# Patient Record
Sex: Male | Born: 1980 | Race: White | Hispanic: No | Marital: Single | Smoking: Never smoker
Health system: Southern US, Community
[De-identification: ages and names within clinical notes are randomized; demographics above are authoritative.]

## PROBLEM LIST (undated history)

## (undated) DIAGNOSIS — N200 Calculus of kidney: Secondary | ICD-10-CM

## (undated) DIAGNOSIS — R21 Rash and other nonspecific skin eruption: Secondary | ICD-10-CM

## (undated) DIAGNOSIS — Z8669 Personal history of other diseases of the nervous system and sense organs: Secondary | ICD-10-CM

## (undated) DIAGNOSIS — K219 Gastro-esophageal reflux disease without esophagitis: Secondary | ICD-10-CM

## (undated) HISTORY — DX: Calculus of kidney: N20.0

## (undated) HISTORY — DX: Gastro-esophageal reflux disease without esophagitis: K21.9

## (undated) HISTORY — DX: Rash and other nonspecific skin eruption: R21

## (undated) HISTORY — DX: Personal history of other diseases of the nervous system and sense organs: Z86.69

## (undated) HISTORY — PX: LITHOTRIPSY: SUR834

---
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
## 2000-03-25 ENCOUNTER — Encounter: Payer: Self-pay | Admitting: Family Medicine

## 2000-03-25 ENCOUNTER — Ambulatory Visit (HOSPITAL_COMMUNITY): Admission: RE | Admit: 2000-03-25 | Discharge: 2000-03-25 | Payer: Self-pay | Admitting: Family Medicine

## 2007-10-28 ENCOUNTER — Emergency Department: Admit: 2007-10-28 | Payer: Self-pay | Source: Emergency Department | Admitting: Emergency Medicine

## 2007-11-21 ENCOUNTER — Ambulatory Visit
Admit: 2007-11-21 | Disposition: A | Discharge status: Home / Self Care | Payer: Self-pay | Source: Ambulatory Visit | Admitting: Orthopaedic Surgery

## 2014-09-25 ENCOUNTER — Emergency Department
Admission: EM | Admit: 2014-09-25 | Discharge: 2014-09-25 | Disposition: A | Discharge status: Home / Self Care | Payer: Commercial Managed Care - POS | Attending: Emergency Medicine | Admitting: Emergency Medicine

## 2014-09-25 ENCOUNTER — Emergency Department: Payer: Commercial Managed Care - POS

## 2014-09-25 DIAGNOSIS — N132 Hydronephrosis with renal and ureteral calculous obstruction: Secondary | ICD-10-CM | POA: Insufficient documentation

## 2014-09-25 DIAGNOSIS — Z87442 Personal history of urinary calculi: Secondary | ICD-10-CM | POA: Insufficient documentation

## 2014-09-25 DIAGNOSIS — N201 Calculus of ureter: Secondary | ICD-10-CM

## 2014-09-25 HISTORY — DX: Calculus of kidney: N20.0

## 2014-09-25 LAB — COMPREHENSIVE METABOLIC PANEL
ALT: 39 U/L (ref 0–55)
AST (SGOT): 19 U/L (ref 5–34)
Albumin/Globulin Ratio: 1.4 (ref 0.9–2.2)
Albumin: 4 g/dL (ref 3.5–5.0)
Alkaline Phosphatase: 68 U/L (ref 38–106)
Anion Gap: 14 (ref 5.0–15.0)
BUN: 19 mg/dL (ref 9.0–28.0)
Bilirubin, Total: 2.2 mg/dL — ABNORMAL HIGH (ref 0.2–1.2)
CO2: 20 mEq/L — ABNORMAL LOW (ref 22–29)
Calcium: 9.2 mg/dL (ref 8.5–10.5)
Chloride: 107 mEq/L (ref 100–111)
Creatinine: 1.1 mg/dL (ref 0.7–1.3)
Globulin: 2.8 g/dL (ref 2.0–3.6)
Glucose: 121 mg/dL — ABNORMAL HIGH (ref 70–100)
Potassium: 4.1 mEq/L (ref 3.5–5.1)
Protein, Total: 6.8 g/dL (ref 6.0–8.3)
Sodium: 141 mEq/L (ref 136–145)

## 2014-09-25 LAB — URINALYSIS
Bilirubin, UA: NEGATIVE
Glucose, UA: NEGATIVE
Ketones UA: NEGATIVE
Leukocyte Esterase, UA: NEGATIVE
Nitrite, UA: NEGATIVE
Protein, UR: 30 — AB
Specific Gravity UA: 1.03 (ref 1.001–1.035)
Urine pH: 5 (ref 5.0–8.0)
Urobilinogen, UA: <2 mg/dL (ref 0.2–2.0)

## 2014-09-25 LAB — CBC AND DIFFERENTIAL
Basophils Absolute Automated: 0.05 10*3/uL (ref 0.00–0.20)
Basophils Automated: 0 %
Eosinophils Absolute Automated: 0.31 10*3/uL (ref 0.00–0.70)
Eosinophils Automated: 3 %
Hematocrit: 52.1 % — ABNORMAL HIGH (ref 42.0–52.0)
Hgb: 17.1 g/dL — ABNORMAL HIGH (ref 13.0–17.0)
Lymphocytes Absolute Automated: 3.42 10*3/uL (ref 0.50–4.40)
Lymphocytes Automated: 38 %
MCH: 29.7 pg (ref 28.0–32.0)
MCHC: 32.8 g/dL (ref 32.0–36.0)
MCV: 90.6 fL (ref 80.0–100.0)
MPV: 10.2 fL (ref 9.4–12.3)
Monocytes Absolute Automated: 0.9 10*3/uL (ref 0.00–1.20)
Monocytes: 10 %
Neutrophils Absolute: 4.45 10*3/uL (ref 1.80–8.10)
Neutrophils: 49 %
Platelets: 257 10*3/uL (ref 140–400)
RBC: 5.75 10*6/uL (ref 4.70–6.00)
RDW: 14 % (ref 12–15)
WBC: 9.13 10*3/uL (ref 3.50–10.80)

## 2014-09-25 LAB — RAPID DRUG SCREEN, URINE
Barbiturate Screen, UR: NEGATIVE
Benzodiazepine Screen, UR: NEGATIVE
Cannabinoid Screen, UR: NEGATIVE
Cocaine, UR: NEGATIVE
Opiate Screen, UR: POSITIVE — AB
PCP Screen, UR: NEGATIVE
Urine Amphetamine Screen: NEGATIVE

## 2014-09-25 LAB — URINE MICROSCOPIC

## 2014-09-25 LAB — ETHANOL: Alcohol: NOT DETECTED mg/dL

## 2014-09-25 LAB — GFR: EGFR: >60

## 2014-09-25 LAB — LIPASE: Lipase: 17 U/L (ref 8–78)

## 2014-09-25 MED ORDER — KETOROLAC TROMETHAMINE 30 MG/ML IJ SOLN
INTRAMUSCULAR | Status: AC
Start: 2014-09-25 — End: 2014-09-25
  Administered 2014-09-25: 30 mg via INTRAVENOUS
  Filled 2014-09-25: qty 1

## 2014-09-25 MED ORDER — MORPHINE SULFATE 4 MG/ML IJ/IV SOLN (WRAP)
4.0000 mg | Freq: Once | Status: AC
Start: 2014-09-25 — End: 2014-09-25
  Administered 2014-09-25: 4 mg via INTRAVENOUS
  Filled 2014-09-25: qty 1

## 2014-09-25 MED ORDER — HYDROMORPHONE HCL 1 MG/ML IJ SOLN
1.0000 mg | Freq: Once | INTRAMUSCULAR | Status: AC
Start: 2014-09-25 — End: 2014-09-25
  Administered 2014-09-25: 1 mg via INTRAVENOUS
  Filled 2014-09-25: qty 1

## 2014-09-25 MED ORDER — ONDANSETRON 4 MG PO TBDP
4.0000 mg | ORAL_TABLET | Freq: Four times a day (QID) | ORAL | Status: AC | PRN
Start: 2014-09-25 — End: 2014-10-02

## 2014-09-25 MED ORDER — OXYCODONE-ACETAMINOPHEN 5-325 MG PO TABS
2.0000 | ORAL_TABLET | Freq: Once | ORAL | Status: AC
Start: 2014-09-25 — End: 2014-09-25
  Administered 2014-09-25: 2 via ORAL
  Filled 2014-09-25: qty 2

## 2014-09-25 MED ORDER — OXYCODONE-ACETAMINOPHEN 5-325 MG PO TABS
ORAL_TABLET | ORAL | Status: DC
Start: 2014-09-25 — End: 2014-09-30

## 2014-09-25 MED ORDER — ONDANSETRON HCL 4 MG/2ML IJ SOLN
4.0000 mg | Freq: Once | INTRAMUSCULAR | Status: AC
Start: 2014-09-25 — End: 2014-09-25
  Administered 2014-09-25: 4 mg via INTRAVENOUS
  Filled 2014-09-25: qty 2

## 2014-09-25 MED ORDER — SODIUM CHLORIDE 0.9 % IV BOLUS
1000.0000 mL | Freq: Once | INTRAVENOUS | Status: AC
Start: 2014-09-25 — End: 2014-09-25
  Administered 2014-09-25: 1000 mL via INTRAVENOUS

## 2014-09-25 MED ORDER — KETOROLAC TROMETHAMINE 30 MG/ML IJ SOLN
30.0000 mg | Freq: Once | INTRAMUSCULAR | Status: AC
Start: 2014-09-25 — End: 2014-09-25

## 2014-09-25 NOTE — ED Provider Notes (Addendum)
Physician/Midlevel provider first contact with patient: 09/25/14 0410         History     Chief Complaint   Patient presents with   . Abdominal Pain     HPI Comments: Sudden onset of sharp left flank and LLQ pain no dysuria/urgency nausea no vomiting - 245 am awakened patient .    No other issues no cough no fever no URI no dizziness    Patient is a 34 y.o. male presenting with abdominal pain.   Abdominal Pain  Pain location:  L flank  Pain quality: sharp    Pain radiates to:  LLQ  Pain severity:  Severe  Onset quality:  Sudden  Duration:  1 hour  Timing:  Constant  Progression:  Waxing and waning  Chronicity:  New  Relieved by:  Nothing  Worsened by:  Nothing tried  Ineffective treatments:  None tried  Associated symptoms: nausea    Associated symptoms: no anorexia, no belching, no chest pain, no chills, no constipation, no cough, no diarrhea, no dysuria, no fatigue, no fever, no flatus, no hematemesis, no hematochezia, no hematuria and no vomiting             Past Medical History   Diagnosis Date   . Calculus of kidney        History reviewed. No pertinent past surgical history.    History reviewed. No pertinent family history.    Social  Social History   Substance Use Topics   . Smoking status: Never Smoker    . Smokeless tobacco: None   . Alcohol Use: Yes      Comment: socially       .     No Known Allergies    Home Medications           No Medications           Review of Systems   Constitutional: Negative for fever, chills and fatigue.   Respiratory: Negative for cough.    Cardiovascular: Negative for chest pain.   Gastrointestinal: Positive for nausea and abdominal pain. Negative for vomiting, diarrhea, constipation, hematochezia, anorexia, flatus and hematemesis.   Genitourinary: Negative for dysuria and hematuria.   All other systems reviewed and are negative.      Physical Exam    BP: (!) 146/93 mmHg, Heart Rate: 67, Temp: 97.6 F (36.4 C), Resp Rate: 20, SpO2: 98 %, Weight: 99.791 kg    Physical Exam    Constitutional: He appears well-developed and well-nourished. He appears distressed.   HENT:   Head: Normocephalic and atraumatic.   Right Ear: External ear normal.   Left Ear: External ear normal.   Nose: Nose normal.   Mouth/Throat: Oropharynx is clear and moist. No oropharyngeal exudate.   Eyes: Conjunctivae and EOM are normal. Pupils are equal, round, and reactive to light. Right eye exhibits no discharge.   Neck: Normal range of motion. Neck supple.   Cardiovascular: Normal rate, regular rhythm, normal heart sounds and intact distal pulses.  Exam reveals no gallop and no friction rub.    No murmur heard.  Pulmonary/Chest: Effort normal and breath sounds normal. No respiratory distress. He has no wheezes. He has no rales. He exhibits no tenderness.   Abdominal: Soft. Bowel sounds are normal.   Musculoskeletal: Normal range of motion.   Neurological: He is alert. He has normal reflexes.   Skin: Skin is warm.   Diaphoretic   Psychiatric: He has a normal mood and affect. His behavior  is normal. Judgment normal.   Nursing note and vitals reviewed.        MDM and ED Course     ED Medication Orders     Start Ordered     Status Ordering Provider    09/25/14 [phone] 09/25/14 9604  HYDROmorphone (DILAUDID) injection 1 mg   Once     Route: Intravenous  Ordered Dose: 1 mg     Last MAR action:  Given Jarom Govan ALAN    09/25/14 0625 09/25/14 0624  oxyCODONE-acetaminophen (PERCOCET) 5-325 MG per tablet 2 tablet   Once     Route: Oral  Ordered Dose: 2 tablet     Last MAR action:  Given Trayonna Bachmeier ALAN    09/25/14 0522 09/25/14 0521  HYDROmorphone (DILAUDID) injection 1 mg   Once     Route: Intravenous  Ordered Dose: 1 mg     Last MAR action:  Given Javaris Wigington ALAN    09/25/14 0429 09/25/14 0428  ketorolac (TORADOL) injection 30 mg   Once     Route: Intravenous  Ordered Dose: 30 mg     Last MAR action:  Given Dioselina Brumbaugh ALAN    09/25/14 0415 09/25/14 0414  sodium chloride 0.9 % bolus 1,000 mL    Once     Route: Intravenous  Ordered Dose: 1,000 mL     Last MAR action:  Stopped Chimamanda Siegfried ALAN    09/25/14 0415 09/25/14 0414  morphine injection 4 mg   Once     Route: Intravenous  Ordered Dose: 4 mg     Last MAR action:  Given Peyson Delao ALAN    09/25/14 0415 09/25/14 0414  ondansetron (ZOFRAN) injection 4 mg   Once     Route: Intravenous  Ordered Dose: 4 mg     Last MAR action:  Given Kenry Daubert ALAN             MDM  Number of Diagnoses or Management Options     Amount and/or Complexity of Data Reviewed  Clinical lab tests: ordered and reviewed  Tests in the radiology section of CPT: ordered and reviewed    Risk of Complications, Morbidity, and/or Mortality  Presenting problems: moderate  Diagnostic procedures: moderate  Management options: moderate    Patient Progress  Patient progress: stable            Procedures    Clinical Impression & Disposition     Clinical Impression  Final diagnoses:   Ureterolithiasis        ED Disposition     Discharge Alric Quan discharge to home/self care.    Condition at disposition: Stable             Discharge Medication List as of 09/25/2014  5:22 AM      START taking these medications    Details   ondansetron (ZOFRAN ODT) 4 MG disintegrating tablet Take 1 tablet (4 mg total) by mouth every 6 (six) hours as needed., Starting 09/25/2014, Until Wed 10/02/14, Print      oxyCODONE-acetaminophen (PERCOCET) 5-325 MG per tablet 1-2 tablets by mouth every 4-6 hours as needed for pain;  Do not drive or operate machinery while taking this medicine, Print                   INDICATION: Left flank pain.    COMPARISON: None.    TECHNIQUE: Noncontrast helical axial CT through the abdomen and pelvis  using 2.5 mm slices with coronal  and sagittal reconstructions.    FINDINGS: The lung bases, lower heart, liver, gallbladder, spleen,  adrenal glands, pancreas, stomach, small bowel, large bowel, appendix,  prostate, urinary bladder, osseous structures and subcutaneous  soft  tissues are unremarkable.    5 x 5 mm obstructing proximal left ureteral calculus with mild to  moderate proximal hydroureter and hydronephrosis. Additionally,  prominent nonobstructing lower renal hilum calculus on the right.    IMPRESSION:   5 x 5 mm obstructing proximal left ureteral calculus with  mild to moderate proximal hydroureter and hydronephrosis.    Aleen Sells, MD   09/25/2014 6:14 AM      Ralph Dowdy Claudius Sis, MD  09/25/14 1610    Joseph Berkshire, MD  09/25/14 9604    Joseph Berkshire, MD  09/27/14 [phone]

## 2014-09-25 NOTE — ED Notes (Signed)
To room per wheelchair alert orientedx4 c/o of left lower quadrant pain since about 0245 , no diarrhea, no vomiting.

## 2014-09-27 ENCOUNTER — Emergency Department: Payer: Commercial Managed Care - POS

## 2014-09-27 ENCOUNTER — Emergency Department
Admission: EM | Admit: 2014-09-27 | Discharge: 2014-09-27 | Disposition: A | Discharge status: Home / Self Care | Payer: Commercial Managed Care - POS | Attending: Emergency Medicine | Admitting: Emergency Medicine

## 2014-09-27 DIAGNOSIS — N200 Calculus of kidney: Secondary | ICD-10-CM | POA: Insufficient documentation

## 2014-09-27 DIAGNOSIS — Z87442 Personal history of urinary calculi: Secondary | ICD-10-CM | POA: Insufficient documentation

## 2014-09-27 MED ORDER — MORPHINE SULFATE 2 MG/ML IJ/IV SOLN (WRAP)
8.0000 mg | Freq: Once | Status: AC
Start: 2014-09-27 — End: 2014-09-27
  Administered 2014-09-27: 8 mg via INTRAVENOUS
  Filled 2014-09-27: qty 4

## 2014-09-27 MED ORDER — SODIUM CHLORIDE 0.9 % IV BOLUS
1000.0000 mL | Freq: Once | INTRAVENOUS | Status: AC
Start: 2014-09-27 — End: 2014-09-27
  Administered 2014-09-27: 1000 mL via INTRAVENOUS

## 2014-09-27 MED ORDER — KETOROLAC TROMETHAMINE 30 MG/ML IJ SOLN
15.0000 mg | Freq: Once | INTRAMUSCULAR | Status: AC
Start: 2014-09-27 — End: 2014-09-27
  Administered 2014-09-27: 15 mg via INTRAVENOUS
  Filled 2014-09-27: qty 1

## 2014-09-27 MED ORDER — ONDANSETRON HCL 4 MG/2ML IJ SOLN
4.0000 mg | Freq: Once | INTRAMUSCULAR | Status: AC
Start: 2014-09-27 — End: 2014-09-27
  Administered 2014-09-27: 4 mg via INTRAVENOUS
  Filled 2014-09-27: qty 2

## 2014-09-27 MED ORDER — IBUPROFEN 600 MG PO TABS
600.0000 mg | ORAL_TABLET | Freq: Four times a day (QID) | ORAL | Status: DC | PRN
Start: 2014-09-27 — End: 2014-11-20

## 2014-09-27 MED ORDER — HYDROCODONE-ACETAMINOPHEN 5-325 MG PO TABS
1.0000 | ORAL_TABLET | ORAL | Status: DC | PRN
Start: 2014-09-27 — End: 2014-11-20

## 2014-09-27 NOTE — Discharge Instructions (Signed)
Prevention Of Recurrent Kidney Stones (Edu)     Here is some information about how to lower your chance of getting a kidney stone again.     Studies show that anyone with a kidney stone in the past has a 1 in 2 chance of having one again within 5 years. However, there are things you can do to reduce this risk.     Your kidneys main job is to filter excess water and wastes from the blood. This is how urine is made. Crystals can separate from the urine and build up or stick on the insides of the kidneys. This is how kidney stones form.      Urine normally has chemicals to keep the clumping or sticking from happening. However, if these chemicals aren t balanced, the stones can form. The solid lump(s) can be as small as a grain of sand to as big as the size of a golf ball! The medical term for a kidney stone is a renal calculus. Another name is nephrolithiasis.     Accordingly to the Kidney and Urologic Diseases Information Clearinghouse, good hydration (fluid intake) is the first step in preventing kidney stones. You should drink lots of fluids, usually about 2 liters (8 glasses) every day. Good hydration helps to water down (dilute) the amount of crystals forming in the urine. Drinking lots of water every day helps to prevent more stones from forming. Also limit alcohol and caffeine use. These can cause you to lose fluids. This results in dehydration.     Another very important factor in the prevention of kidney stones is our daily diet. A recent study from [employer] found that diet has a strong effect on the pH (acidity) of urine. A diet rich in purine foods can increase uric acid levels. This can then result in a low urine pH. Purine foods include red meat, chicken, fish and green tea. Low pH in the urine can cause crystal to form. This leads to a certain type of kidney stone.      On the other hand, strict vegetarians eat a lot of products with citrate. Some foods with a lot of citrate are grapefruits,  oranges, tangerines, lemons, pineapples and grapes. There is also a lot of citrate in carbonated drinks. This type of diet can mean your kidneys excrete more citrate. This increases your uric acid levels.     Lower levels of salt, calcium and oxalate in the diet has also been shown to lower the risk for kidney stones. Foods with a lot of sodium include canned soups and meat, frozen dinners, potato chips, some condiments and table salt. Some calcium-rich foods are dairy products, sardines and salmon. Some oxalate-rich foods are spinach, peanuts, sweet potatoes, tea, and chocolate. A lot of salt, calcium and oxalate in your diet increases the amount of calcium dumped in your urine. These high levels of calcium can cause one of the most common types of kidney stones. These are called calcium stones. About 4 in 5 of all kidney stones are calcium stones.      It is NOT recommended that you stop eating these foods altogether. It is better to restrict or limit the amount of certain foods or categories of food. Eating foods in MODERATION is best.     For more information, contact your doctor. You can also visit the Kidney and Urologic Diseases Information Clearinghouse web sites or the [employer] home page.

## 2014-09-27 NOTE — ED Notes (Signed)
Patient here for eval of left flank and lower abdominal pain.  C/o nausea/vomiting.  Alert and oriented x 3.  No apparent distress.

## 2014-09-27 NOTE — ED Provider Notes (Signed)
Physician/Midlevel provider first contact with patient: 09/27/14 1610         History     Chief Complaint   Patient presents with   . Flank Pain     Patient is a 34 y.o. male presenting with flank pain. The history is provided by the patient.   Flank Pain  This is a recurrent problem. The current episode started today. The problem occurs constantly. The problem has been unchanged. Associated symptoms include abdominal pain. Pertinent negatives include no chest pain, chills, coughing, fever or vomiting. Nothing aggravates the symptoms. He has tried nothing for the symptoms.    pt seen recently and was dx kidney stone.  Pain now lower and less in back.  No fever.  Percocet not working.  No nsaid taken.  Has apt. With urologist Monday but was just hoping I could remove it.      Past Medical History   Diagnosis Date   . Calculus of kidney        History reviewed. No pertinent past surgical history.    History reviewed. No pertinent family history.    Social  Social History   Substance Use Topics   . Smoking status: Never Smoker    . Smokeless tobacco: None   . Alcohol Use: Yes      Comment: socially       .     No Known Allergies    Home Medications     Last Medication Reconciliation Action:  In Progress Elana Alm, RN 09/27/2014 10:10 AM                  ondansetron (ZOFRAN ODT) 4 MG disintegrating tablet     Take 1 tablet (4 mg total) by mouth every 6 (six) hours as needed.     oxyCODONE-acetaminophen (PERCOCET) 5-325 MG per tablet     1-2 tablets by mouth every 4-6 hours as needed for pain;  Do not drive or operate machinery while taking this medicine           Review of Systems   Constitutional: Negative for fever and chills.   HENT: Negative for rhinorrhea.    Respiratory: Negative for cough.    Cardiovascular: Negative for chest pain.   Gastrointestinal: Positive for abdominal pain. Negative for vomiting.   Genitourinary: Positive for flank pain.   All other systems reviewed and are  negative.      Physical Exam    BP: 117/67 mmHg, Heart Rate: 86, Temp: 98.4 F (36.9 C), Resp Rate: 16, SpO2: 93 %, Weight: 99.791 kg    Physical Exam  Nursing note and vitals reviewed.  Constitutional:  Well developed, well nourished. Awake & Oriented x3.  Head:  Atraumatic. Normocephalic.    Eyes:  PERRL. EOMI. Conjunctivae are not pale.  ENT:  Mucous membranes are moist and intact. Oropharynx is clear and symmetric.  Patent airway.  Neck:  Supple. Full ROM.    Cardiovascular:  Regular rate. Regular rhythm. No murmurs, rubs, or gallops.  Pulmonary/Chest:  No evidence of respiratory distress. Clear to auscultation bilaterally.  No wheezing, rales or rhonchi.   Abdominal:  Soft and non-distended. There is very mild suprapubic tenderness. No rebound, guarding, or rigidity.  Back:  Full ROM. Mild left cva pain.  Extremities:  No edema. No cyanosis. No clubbing. Full range of motion in all extremities.  Skin:  Skin is warm and dry.  No diaphoresis. No rash.   Neurological:  Alert, awake, and appropriate.  Normal speech. Motor normal.  Psychiatric:  Good eye contact. Normal interaction, affect, and behavior.    Pox 93 low normal no tx needed.    MDM and ED Course     ED Medication Orders     Start Ordered     Status Ordering Provider    09/27/14 1133 09/27/14 1132  ketorolac (TORADOL) injection 15 mg   Once     Route: Intravenous  Ordered Dose: 15 mg     Last MAR action:  Given Lincoln Ginley C    09/27/14 1133 09/27/14 1132  morphine injection 8 mg   Once     Route: Intravenous  Ordered Dose: 8 mg     Last MAR action:  Given Sircharles Holzheimer C    09/27/14 1002 09/27/14 1001  sodium chloride 0.9 % bolus 1,000 mL   Once     Route: Intravenous  Ordered Dose: 1,000 mL     Last MAR action:  New Bag Shabre Kreher C    09/27/14 1002 09/27/14 1001  morphine injection 8 mg   Once     Route: Intravenous  Ordered Dose: 8 mg     Last MAR action:  Given Shavona Gunderman C    09/27/14 1002 09/27/14 1001  ondansetron (ZOFRAN)  injection 4 mg   Once     Route: Intravenous  Ordered Dose: 4 mg     Last MAR action:  Given Diontae Route C    09/27/14 1002 09/27/14 1001  ketorolac (TORADOL) injection 15 mg   Once     Route: Intravenous  Ordered Dose: 15 mg     Last MAR action:  Given Willisha Sligar C             MDM  Pt more comfortable after pain meds.  Will [place] to [facility]        Procedures    Clinical Impression & Disposition     Clinical Impression  Final diagnoses:   Kidney stone        ED Disposition     Discharge Alric Quan discharge to home/self care.    Condition at disposition: Stable             Discharge Medication List as of 09/27/2014 11:35 AM                      Kennith Maes, MD  09/27/14 2221

## 2014-10-02 ENCOUNTER — Encounter
Admission: RE | Disposition: A | Discharge status: Home / Self Care | Payer: Self-pay | Source: Ambulatory Visit | Attending: Urology

## 2014-10-02 ENCOUNTER — Ambulatory Visit: Payer: Commercial Managed Care - POS | Admitting: Anesthesiology

## 2014-10-02 ENCOUNTER — Ambulatory Visit: Payer: Commercial Managed Care - POS

## 2014-10-02 ENCOUNTER — Ambulatory Visit: Payer: Commercial Managed Care - POS | Admitting: Urology

## 2014-10-02 ENCOUNTER — Ambulatory Visit
Admission: RE | Admit: 2014-10-02 | Discharge: 2014-10-02 | Disposition: A | Discharge status: Home / Self Care | Payer: Commercial Managed Care - POS | Source: Ambulatory Visit | Attending: Urology | Admitting: Urology

## 2014-10-02 DIAGNOSIS — N201 Calculus of ureter: Secondary | ICD-10-CM

## 2014-10-02 DIAGNOSIS — N132 Hydronephrosis with renal and ureteral calculous obstruction: Secondary | ICD-10-CM | POA: Insufficient documentation

## 2014-10-02 HISTORY — PX: ESWL: SHX3906

## 2014-10-02 HISTORY — DX: Gastro-esophageal reflux disease without esophagitis: K21.9

## 2014-10-02 HISTORY — DX: Rash and other nonspecific skin eruption: R21

## 2014-10-02 HISTORY — DX: Personal history of other diseases of the nervous system and sense organs: Z86.69

## 2014-10-02 SURGERY — LITHOTRIPSY, EXTRACORPOREAL SHOCK WAVE (ESWL)
Anesthesia: Anesthesia General | Site: Flank | Laterality: Left | Wound class: Clean

## 2014-10-02 MED ORDER — OXYCODONE-ACETAMINOPHEN 5-325 MG PO TABS
1.0000 | ORAL_TABLET | ORAL | Status: DC | PRN
Start: 2014-10-02 — End: 2014-10-02
  Administered 2014-10-02: 1 via ORAL

## 2014-10-02 MED ORDER — ONDANSETRON HCL 4 MG/2ML IJ SOLN
INTRAMUSCULAR | Status: DC | PRN
Start: 2014-10-02 — End: 2014-10-02
  Administered 2014-10-02: 4 mg via INTRAVENOUS

## 2014-10-02 MED ORDER — EPHEDRINE SULFATE 50 MG/ML IJ SOLN
INTRAMUSCULAR | Status: AC
Start: 2014-10-02 — End: ?
  Filled 2014-10-02: qty 1

## 2014-10-02 MED ORDER — ONDANSETRON HCL 4 MG/2ML IJ SOLN
INTRAMUSCULAR | Status: AC
Start: 2014-10-02 — End: ?
  Filled 2014-10-02: qty 2

## 2014-10-02 MED ORDER — MIDAZOLAM HCL 2 MG/2ML IJ SOLN
INTRAMUSCULAR | Status: DC | PRN
Start: 2014-10-02 — End: 2014-10-02
  Administered 2014-10-02: 2 mg via INTRAVENOUS

## 2014-10-02 MED ORDER — LIDOCAINE HCL 2 % IJ SOLN
INTRAMUSCULAR | Status: DC | PRN
Start: 2014-10-02 — End: 2014-10-02
  Administered 2014-10-02: 100 mg

## 2014-10-02 MED ORDER — PROPOFOL INFUSION 10 MG/ML
INTRAVENOUS | Status: DC | PRN
Start: 2014-10-02 — End: 2014-10-02
  Administered 2014-10-02: 200 mg via INTRAVENOUS

## 2014-10-02 MED ORDER — EPHEDRINE SULFATE 50 MG/ML IJ SOLN
INTRAMUSCULAR | Status: DC | PRN
Start: 2014-10-02 — End: 2014-10-02
  Administered 2014-10-02 (×2): 10 mg via INTRAVENOUS

## 2014-10-02 MED ORDER — FENTANYL CITRATE (PF) 50 MCG/ML IJ SOLN (WRAP)
INTRAMUSCULAR | Status: AC
Start: 2014-10-02 — End: ?
  Filled 2014-10-02: qty 2

## 2014-10-02 MED ORDER — MIDAZOLAM HCL 2 MG/2ML IJ SOLN
INTRAMUSCULAR | Status: AC
Start: 2014-10-02 — End: ?
  Filled 2014-10-02: qty 2

## 2014-10-02 MED ORDER — FENTANYL CITRATE (PF) 50 MCG/ML IJ SOLN (WRAP)
INTRAMUSCULAR | Status: DC | PRN
Start: 2014-10-02 — End: 2014-10-02
  Administered 2014-10-02 (×2): 50 ug via INTRAVENOUS

## 2014-10-02 MED ORDER — LIDOCAINE HCL (PF) 2 % IJ SOLN
INTRAMUSCULAR | Status: AC
Start: 2014-10-02 — End: ?
  Filled 2014-10-02: qty 5

## 2014-10-02 MED ORDER — LACTATED RINGERS IV SOLN
INTRAVENOUS | Status: DC
Start: 2014-10-02 — End: 2014-10-02

## 2014-10-02 MED ORDER — PROPOFOL 10 MG/ML IV EMUL (WRAP)
INTRAVENOUS | Status: AC
Start: 2014-10-02 — End: ?
  Filled 2014-10-02: qty 20

## 2014-10-02 MED ORDER — OXYCODONE-ACETAMINOPHEN 5-325 MG PO TABS
ORAL_TABLET | ORAL | Status: AC
Start: 2014-10-02 — End: ?
  Filled 2014-10-02: qty 1

## 2014-10-02 SURGICAL SUPPLY — 1 items: HLDR LIMB QUILTED SINGLE STRAP (Procedure Accessories) ×2 IMPLANT

## 2014-10-02 NOTE — Discharge Instructions (Signed)
Shock Wave Lithotripsy  Passing a kidney stone can be very painful. Shock wave lithotripsyis a treatment that helps by breaking the kidney stone into smaller pieces that are easier to pass. This treatment is also called extracorporeal shock wave lithotripsy (ESWL). Lithotripsy takes about an hour. It's done in a hospital, lithotripsy center, or mobile lithotripsy van. You will likely go home the same day.This treatment is not used for all types of kidney stones. Your health care provider will discuss whether this is the right treatment for the type of stone you have.    During the Procedure   You receive medication to prevent pain and help you relax or sleep during lithotripsy. Once this takes effect, the procedure will start.   Astent (flexible tube with holes in it) may be placed into your ureter (the tube that connects the kidney and the bladder). This helps keep urine flowing from the kidney.   Your health care provider then uses X-ray or ultrasound to find the exact location of the kidney stone.   Sound waves are aimed at the stone and sent at high speed. If you're awake, you may feel a tapping as they pass through your body.  After the Procedure   You'll be monitored in a recovery room for about 1 hour to 3 hours. Antibiotics and pain medication may be prescribed before you leave.   You'll have a follow-up visit in a few weeks. If you received a stent, it will be removed. Your doctor will also check for pieces of stone. If large pieces remain, you may need a second lithotripsy or another procedure.    Passing the Stone  It can take a day to several weeks for the pieces of stone to leave your body. Drink plenty of liquids to help flush your system. During this time:   Your urine may be cloudy or slightly bloody.You may even see small pieces of stone.   You may have a slight fever and some pain. Take prescribed or over-the-counter pain medication as instructed by your health care provider.   You  may be asked to strain your urine to collect some stone particles. These will be studied in the lab.     2000-2015 [employer] [address], [place] [address]. All rights reserved. This information is not intended as a substitute for professional medical care. Always follow your healthcare professional's instructions.      Post Anesthesia Discharge Instructions    Although you may be awake and alert in the recovery room, small amounts of anesthetic remain in your system for about 24 hours.  You may feel tired and sleepy during this time.      You are advised to go directly home from the hospital.    Plan to stay at home and rest for the remainder of the day.    It is advisable to have someone with you at home for 24 hours after surgery.    Do not operate a motor vehicle, or any mechanical or electrical equipment for the next 24 hours.      Be careful when you are walking around, you may become dizzy.  The effects of anesthesia and/or medications are still present and drowsiness may occur    Do not consume alcohol, tranquilizers, sleeping medications, or any other non prescribed medication for the remainder of the day.    Diet:  begin with liquids, progress your diet as tolerated or as directed by your surgeon.    Nausea and vomiting may occur in the next 24 hours.

## 2014-10-02 NOTE — Brief Op Note (Signed)
BRIEF OP NOTE    Date Time: 10/02/2014 11:07 AM    Patient Name:   Tristan Barnes    Date of Operation:   10/02/2014    Providers Performing:   Surgeon(s):  Maurice Small Jule Ser, MD    Assistant (s):   Circulator: Renae Fickle, RN  Relief Circulator: Herma Carson, RN  Preceptor: Sharia Reeve, RN    Operative Procedure:   Procedure(s):  ESWL, LEFT URETER    Preoperative Diagnosis:   Pre-Op Diagnosis Codes:     * Ureteral calculus [N20.1]    Postoperative Diagnosis:   Post-Op Diagnosis Codes:     * Ureteral calculus [N20.1]    Anesthesia:   General    Estimated Blood Loss:    * No values recorded between 10/02/2014 10:40 AM and 10/02/2014 11:07 AM *    Implants:   * No implants in log *    Drains:   Drains: None    Specimens:   None    Findings:   Left ureteral calculus    Complications:   None      Signed by: Jeremy Johann, MD                                                                           [facility]

## 2014-10-02 NOTE — Anesthesia Postprocedure Evaluation (Signed)
Anesthesia Post Evaluation    Patient: Tristan Barnes    Procedures performed: Procedure(s):  ESWL    Anesthesia type: General LMA    Patient location:PACU    Last vitals:   Filed Vitals:    10/02/14 1200   BP: 130/80   Pulse: 84   Temp:    Resp: 16   SpO2: 98%       Post pain: Patient not complaining of pain, continue current therapy      Mental Status:awake    Respiratory Function: tolerating room air    Cardiovascular: stable    Nausea/Vomiting: patient not complaining of nausea or vomiting    Hydration Status: adequate    Post assessment: no apparent anesthetic complications

## 2014-10-02 NOTE — Transfer of Care (Signed)
Anesthesia Transfer of Care Note    Patient: Tristan Barnes    Procedures performed: Procedure(s):  ESWL    Anesthesia type: General LMA    Patient location:Phase II PACU    Last vitals:   Filed Vitals:    10/02/14 1114   BP: 131/71   Pulse: 89   Temp: 36.1 C (97 F)   Resp: 12   SpO2: 97%       Post pain: Patient not complaining of pain, continue current therapy      Mental Status:awake    Respiratory Function: tolerating room air    Cardiovascular: stable    Nausea/Vomiting: patient not complaining of nausea or vomiting    Hydration Status: adequate    Post assessment: no apparent anesthetic complications and no reportable events

## 2014-10-02 NOTE — Interval H&P Note (Signed)
H&P reviewed  Pt examined  No changes noted

## 2014-10-02 NOTE — Op Note (Signed)
Procedure Date: 10/02/2014     Patient Type: A     SURGEON: Jeremy Johann MD  ASSISTANT:       PREOPERATIVE DIAGNOSIS:  Left ureteral calculus.     POSTOPERATIVE DIAGNOSIS:  Left ureteral calculus.     TITLE OF PROCEDURE:  Extracorporeal shockwave lithotripsy to left ureteral calculus.     ANESTHESIA:  General with LMA.     INDICATIONS FOR PROCEDURE:  This 34 year old male had the recent onset of left flank pain and was found  to have a 5-mm calculus in his left ureter near L3 causing some  hydronephrosis.  KUB demonstrates a stone in similar area, although it  measures closer to 7 mm on that technique.  Due to his symptoms, he is  admitted for shockwave lithotripsy.     DESCRIPTION OF PROCEDURE:  With the patient in the supine position on the lithotripter table, he was  placed under general anesthesia.  He was then positioned so that the stone  in the left ureter was at the focal point of the shockwave generator.   Treatment was started at 1 kV and gradually increased up to 20 kV.  The  stone was easily visualized, and as it was treated, seemed decreased in its  appearance.  By the conclusion of the procedure, it was very difficult to  see any residual stone.  The patient tolerated the procedure without  difficulty and was taken to the PACU in satisfactory condition.           D:  10/02/2014 11:15 AM by Dr. Jule Ser. Maurice Small, MD [phone])  T:  10/02/2014 11:57 AM by       Everlean Cherry: [id number]) (Doc ID: [id number])

## 2014-10-02 NOTE — Anesthesia Preprocedure Evaluation (Signed)
Anesthesia Evaluation    AIRWAY    Mallampati: III    TM distance: >3 FB  Neck ROM: full  Mouth Opening:full   CARDIOVASCULAR    cardiovascular exam normal       DENTAL    no notable dental hx     PULMONARY    pulmonary exam normal     OTHER FINDINGS                      Anesthesia Plan    ASA 2     general               (The most common side effects from anesthesia are nausea, vomiting, sore throat.  Uncommon side effects include but not limited to  injury to eyes, lips, teeth, gums, vocal cords, allergic rections, nerve injuries. In addition there are side effects associated with your medical conditions.    Answered all questions to patient's satisfaction.  Pt understands and wishes to proceed.    Kajal Scalici Robert Alexee Delsanto, MD  07/20/2011 )      intravenous induction           Post op pain management: per surgeon    informed consent obtained    Plan discussed with CRNA.

## 2014-10-03 ENCOUNTER — Encounter: Payer: Self-pay | Admitting: Urology

## 2014-10-18 ENCOUNTER — Other Ambulatory Visit (FREE_STANDING_LABORATORY_FACILITY): Payer: Commercial Managed Care - POS

## 2014-10-18 DIAGNOSIS — N200 Calculus of kidney: Secondary | ICD-10-CM

## 2014-10-23 LAB — STONE ANALYSIS

## 2014-11-20 ENCOUNTER — Emergency Department
Admission: EM | Admit: 2014-11-20 | Discharge: 2014-11-20 | Disposition: A | Discharge status: Other Acute Inpatient Hospital | Payer: Commercial Managed Care - POS | Attending: Emergency Medicine | Admitting: Emergency Medicine

## 2014-11-20 ENCOUNTER — Emergency Department: Payer: Commercial Managed Care - POS

## 2014-11-20 ENCOUNTER — Observation Stay: Payer: Commercial Managed Care - POS | Admitting: Certified Registered"

## 2014-11-20 ENCOUNTER — Observation Stay: Payer: Commercial Managed Care - POS

## 2014-11-20 ENCOUNTER — Observation Stay
Admission: RE | Admit: 2014-11-20 | Discharge: 2014-11-21 | Disposition: A | Discharge status: Home / Self Care | Payer: Commercial Managed Care - POS | Source: Ambulatory Visit | Attending: Family Medicine | Admitting: Family Medicine

## 2014-11-20 ENCOUNTER — Observation Stay: Payer: Commercial Managed Care - POS | Admitting: Internal Medicine

## 2014-11-20 ENCOUNTER — Ambulatory Visit: Admission: AD | Admit: 2014-11-20 | Payer: Commercial Managed Care - POS | Source: Ambulatory Visit | Admitting: Urology

## 2014-11-20 ENCOUNTER — Encounter
Admission: RE | Disposition: A | Discharge status: Home / Self Care | Payer: Self-pay | Source: Ambulatory Visit | Attending: Urology

## 2014-11-20 DIAGNOSIS — N200 Calculus of kidney: Secondary | ICD-10-CM

## 2014-11-20 DIAGNOSIS — Z87442 Personal history of urinary calculi: Secondary | ICD-10-CM | POA: Insufficient documentation

## 2014-11-20 DIAGNOSIS — R740 Nonspecific elevation of levels of transaminase and lactic acid dehydrogenase [LDH]: Secondary | ICD-10-CM | POA: Insufficient documentation

## 2014-11-20 DIAGNOSIS — N132 Hydronephrosis with renal and ureteral calculous obstruction: Principal | ICD-10-CM | POA: Insufficient documentation

## 2014-11-20 DIAGNOSIS — K219 Gastro-esophageal reflux disease without esophagitis: Secondary | ICD-10-CM | POA: Insufficient documentation

## 2014-11-20 DIAGNOSIS — N139 Obstructive and reflux uropathy, unspecified: Secondary | ICD-10-CM

## 2014-11-20 HISTORY — PX: LITHOTRIPSY, EXTRACORPOREAL SHOCK WAVE (ESWL): SHX3906

## 2014-11-20 HISTORY — PX: ESWL: SHX3906

## 2014-11-20 LAB — URINALYSIS
Bilirubin, UA: NEGATIVE
Glucose, UA: NEGATIVE
Ketones UA: NEGATIVE
Leukocyte Esterase, UA: NEGATIVE
Nitrite, UA: NEGATIVE
Protein, UR: 30 — AB
Specific Gravity UA: 1.03 (ref 1.001–1.035)
Urine pH: 5 (ref 5.0–8.0)
Urobilinogen, UA: 2 mg/dL (ref 0.2–2.0)

## 2014-11-20 LAB — COMPREHENSIVE METABOLIC PANEL
ALT: 90 U/L — ABNORMAL HIGH (ref 0–55)
AST (SGOT): 40 U/L — ABNORMAL HIGH (ref 5–34)
Albumin/Globulin Ratio: 1.3 (ref 0.9–2.2)
Albumin: 3.9 g/dL (ref 3.5–5.0)
Alkaline Phosphatase: 76 U/L (ref 38–106)
Anion Gap: 13 (ref 5.0–15.0)
BUN: 19 mg/dL (ref 9.0–28.0)
Bilirubin, Total: 1.8 mg/dL — ABNORMAL HIGH (ref 0.2–1.2)
CO2: 20 mEq/L — ABNORMAL LOW (ref 22–29)
Calcium: 9 mg/dL (ref 8.5–10.5)
Chloride: 105 mEq/L (ref 100–111)
Creatinine: 1.1 mg/dL (ref 0.7–1.3)
Globulin: 3.1 g/dL (ref 2.0–3.6)
Glucose: 116 mg/dL — ABNORMAL HIGH (ref 70–100)
Potassium: 4.3 mEq/L (ref 3.5–5.1)
Protein, Total: 7 g/dL (ref 6.0–8.3)
Sodium: 138 mEq/L (ref 136–145)

## 2014-11-20 LAB — CBC AND DIFFERENTIAL
Basophils Absolute Automated: 0.03 10*3/uL (ref 0.00–0.20)
Basophils Automated: 0 %
Eosinophils Absolute Automated: 0.26 10*3/uL (ref 0.00–0.70)
Eosinophils Automated: 3 %
Hematocrit: 48.6 % (ref 42.0–52.0)
Hgb: 15.9 g/dL (ref 13.0–17.0)
Lymphocytes Absolute Automated: 1.9 10*3/uL (ref 0.50–4.40)
Lymphocytes Automated: 24 %
MCH: 29.8 pg (ref 28.0–32.0)
MCHC: 32.7 g/dL (ref 32.0–36.0)
MCV: 91.2 fL (ref 80.0–100.0)
MPV: 10.3 fL (ref 9.4–12.3)
Monocytes Absolute Automated: 0.71 10*3/uL (ref 0.00–1.20)
Monocytes: 9 %
Neutrophils Absolute: 5.13 10*3/uL (ref 1.80–8.10)
Neutrophils: 64 %
Platelets: 274 10*3/uL (ref 140–400)
RBC: 5.33 10*6/uL (ref 4.70–6.00)
RDW: 13 % (ref 12–15)
WBC: 8.03 10*3/uL (ref 3.50–10.80)

## 2014-11-20 LAB — URINE MICROSCOPIC

## 2014-11-20 LAB — HEMOLYSIS INDEX
Hemolysis Index: 18 (ref 0–18)
Hemolysis Index: 13 (ref 0–18)

## 2014-11-20 LAB — GFR: EGFR: >60

## 2014-11-20 LAB — HEPATITIS B SURFACE ANTIGEN W/ REFLEX TO CONFIRMATION: Hepatitis B Surface Antigen: NONREACTIVE

## 2014-11-20 LAB — PHOSPHORUS: Phosphorus: 4.6 mg/dL (ref 2.3–4.7)

## 2014-11-20 LAB — HEPATITIS C ANTIBODY: Hepatitis C, AB: NONREACTIVE

## 2014-11-20 LAB — MAGNESIUM: Magnesium: 2.2 mg/dL (ref 1.6–2.6)

## 2014-11-20 SURGERY — LITHOTRIPSY, EXTRACORPOREAL SHOCK WAVE (ESWL)
Anesthesia: Anesthesia General | Site: Flank | Laterality: Right | Wound class: Clean

## 2014-11-20 MED ORDER — ONDANSETRON 4 MG PO TBDP
4.0000 mg | ORAL_TABLET | Freq: Three times a day (TID) | ORAL | Status: DC | PRN
Start: 2014-11-20 — End: 2014-11-21

## 2014-11-20 MED ORDER — EPHEDRINE SULFATE 50 MG/ML IJ SOLN
INTRAMUSCULAR | Status: DC | PRN
Start: 2014-11-20 — End: 2014-11-20
  Administered 2014-11-20 (×2): 10 mg via INTRAVENOUS

## 2014-11-20 MED ORDER — KETOROLAC TROMETHAMINE 30 MG/ML IJ SOLN
30.0000 mg | Freq: Once | INTRAMUSCULAR | Status: AC
Start: 2014-11-20 — End: 2014-11-20
  Administered 2014-11-20: 30 mg via INTRAVENOUS
  Filled 2014-11-20: qty 1

## 2014-11-20 MED ORDER — SODIUM CHLORIDE 0.9 % IV BOLUS
1000.0000 mL | Freq: Once | INTRAVENOUS | Status: AC
Start: 2014-11-20 — End: 2014-11-20
  Administered 2014-11-20: 1000 mL via INTRAVENOUS

## 2014-11-20 MED ORDER — PROPOFOL INFUSION 10 MG/ML
INTRAVENOUS | Status: DC | PRN
Start: 2014-11-20 — End: 2014-11-20
  Administered 2014-11-20: 200 mg via INTRAVENOUS

## 2014-11-20 MED ORDER — DEXAMETHASONE SODIUM PHOSPHATE 4 MG/ML IJ SOLN (WRAP)
INTRAMUSCULAR | Status: DC | PRN
Start: 2014-11-20 — End: 2014-11-20
  Administered 2014-11-20: 8 mg via INTRAVENOUS

## 2014-11-20 MED ORDER — MIDAZOLAM HCL 2 MG/2ML IJ SOLN
INTRAMUSCULAR | Status: DC | PRN
Start: 2014-11-20 — End: 2014-11-20
  Administered 2014-11-20: 2 mg via INTRAVENOUS

## 2014-11-20 MED ORDER — ONDANSETRON HCL 4 MG/2ML IJ SOLN
4.0000 mg | Freq: Once | INTRAMUSCULAR | Status: AC
Start: 2014-11-20 — End: 2014-11-20
  Administered 2014-11-20: 4 mg via INTRAVENOUS
  Filled 2014-11-20: qty 2

## 2014-11-20 MED ORDER — DEXAMETHASONE SOD PHOSPHATE PF 10 MG/ML IJ SOLN
INTRAMUSCULAR | Status: AC
Start: 2014-11-20 — End: ?
  Filled 2014-11-20: qty 1

## 2014-11-20 MED ORDER — SODIUM CHLORIDE 0.9 % IV SOLN
INTRAVENOUS | Status: AC
Start: 2014-11-20 — End: ?
  Filled 2014-11-20: qty 100

## 2014-11-20 MED ORDER — ENOXAPARIN SODIUM 40 MG/0.4ML SC SOLN
40.0000 mg | Freq: Every day | SUBCUTANEOUS | Status: DC
Start: 2014-11-20 — End: 2014-11-20
  Filled 2014-11-20: qty 0.4

## 2014-11-20 MED ORDER — DEXTROSE-SODIUM CHLORIDE 5-0.9 % IV SOLN
INTRAVENOUS | Status: DC
Start: 2014-11-20 — End: 2014-11-21

## 2014-11-20 MED ORDER — FENTANYL CITRATE (PF) 50 MCG/ML IJ SOLN (WRAP)
INTRAMUSCULAR | Status: DC | PRN
Start: 2014-11-20 — End: 2014-11-20
  Administered 2014-11-20: 50 ug via INTRAVENOUS

## 2014-11-20 MED ORDER — MIDAZOLAM HCL 2 MG/2ML IJ SOLN
INTRAMUSCULAR | Status: AC
Start: 2014-11-20 — End: ?
  Filled 2014-11-20: qty 2

## 2014-11-20 MED ORDER — MORPHINE SULFATE 10 MG/ML IJ/IV SOLN (WRAP)
6.0000 mg | Freq: Once | Status: AC
Start: 2014-11-20 — End: 2014-11-20
  Administered 2014-11-20: 6 mg via INTRAVENOUS
  Filled 2014-11-20: qty 1

## 2014-11-20 MED ORDER — MORPHINE SULFATE 4 MG/ML IJ/IV SOLN (WRAP)
4.0000 mg | Status: DC | PRN
Start: 2014-11-20 — End: 2014-11-20
  Administered 2014-11-20 (×2): 4 mg via INTRAVENOUS
  Filled 2014-11-20 (×2): qty 1

## 2014-11-20 MED ORDER — CEFAZOLIN SODIUM 1 G IJ SOLR
INTRAMUSCULAR | Status: DC | PRN
Start: 2014-11-20 — End: 2014-11-20
  Administered 2014-11-20: 1 g via INTRAVENOUS

## 2014-11-20 MED ORDER — LIDOCAINE HCL (PF) 2 % IJ SOLN
INTRAMUSCULAR | Status: AC
Start: 2014-11-20 — End: ?
  Filled 2014-11-20: qty 5

## 2014-11-20 MED ORDER — FENTANYL CITRATE (PF) 50 MCG/ML IJ SOLN (WRAP)
INTRAMUSCULAR | Status: AC
Start: 2014-11-20 — End: ?
  Filled 2014-11-20: qty 2

## 2014-11-20 MED ORDER — CEFAZOLIN SODIUM 1 G IJ SOLR
INTRAMUSCULAR | Status: AC
Start: 2014-11-20 — End: ?
  Filled 2014-11-20: qty 1000

## 2014-11-20 MED ORDER — LACTATED RINGERS IV SOLN
INTRAVENOUS | Status: DC | PRN
Start: 2014-11-20 — End: 2014-11-20

## 2014-11-20 MED ORDER — INFLUENZA VAC SPLIT QUAD 0.5 ML IM SUSY
0.5000 mL | PREFILLED_SYRINGE | INTRAMUSCULAR | Status: DC | PRN
Start: 2014-11-20 — End: 2014-11-20

## 2014-11-20 MED ORDER — NALOXONE HCL 0.4 MG/ML IJ SOLN (WRAP)
0.2000 mg | INTRAMUSCULAR | Status: DC | PRN
Start: 2014-11-20 — End: 2014-11-20

## 2014-11-20 MED ORDER — OXYCODONE-ACETAMINOPHEN 5-325 MG PO TABS
1.0000 | ORAL_TABLET | ORAL | Status: DC | PRN
Start: 2014-11-20 — End: 2014-11-21
  Administered 2014-11-20: 1 via ORAL
  Filled 2014-11-20: qty 1

## 2014-11-20 MED ORDER — ONDANSETRON HCL 4 MG/2ML IJ SOLN
INTRAMUSCULAR | Status: AC
Start: 2014-11-20 — End: ?
  Filled 2014-11-20: qty 2

## 2014-11-20 MED ORDER — ONDANSETRON HCL 4 MG/2ML IJ SOLN
4.0000 mg | Freq: Three times a day (TID) | INTRAMUSCULAR | Status: DC | PRN
Start: 2014-11-20 — End: 2014-11-21

## 2014-11-20 MED ORDER — PROPOFOL 10 MG/ML IV EMUL (WRAP)
INTRAVENOUS | Status: AC
Start: 2014-11-20 — End: ?
  Filled 2014-11-20: qty 20

## 2014-11-20 MED ORDER — ONDANSETRON HCL 4 MG/2ML IJ SOLN
INTRAMUSCULAR | Status: DC | PRN
Start: 2014-11-20 — End: 2014-11-20
  Administered 2014-11-20: 4 mg via INTRAVENOUS

## 2014-11-20 MED ORDER — LIDOCAINE HCL 2 % IJ SOLN
INTRAMUSCULAR | Status: DC | PRN
Start: 2014-11-20 — End: 2014-11-20
  Administered 2014-11-20: 100 mg

## 2014-11-20 SURGICAL SUPPLY — 1 items: HLDR LIMB QUILTED SINGLE STRAP (Procedure Accessories) ×2 IMPLANT

## 2014-11-20 NOTE — Brief Op Note (Signed)
Patient was given ancef 1 gm preoperatively.[place] abdominal  X- Ray was reviewd with the patient then patient was brought to the operating room and placed on the table in the supine position.  Patient was then placed under general anesthesia via LMA.  The stone was then identified under flouroscopy.  The stone was treated with 3000 shocks at maximum power of 20 KV.  The stone appeared to be fragment under fluoroscopic views by the end of the procedure.The patient was then transferred to the recovery room in stable condition without complications.

## 2014-11-20 NOTE — Consults (Signed)
ADMISSION HISTORY AND PHYSICAL EXAM    Date Time: 11/20/2014 4:25 PM  Patient Name: Tristan Barnes  Attending Physician: Shelba Flake, MD      Chief Complaint:   Right renal colic   History of Present Illness:   Tristan Barnes is a 34 y.o. male who presents to the hospital ER with right renal colic with nausea and no fever . Patient admitted for pain  management and urological consultation was requested for further treatment . PMHx of Kidney stones S/P Lithotripsy last month for left kidney stone    Patient Active Problem List   Diagnosis   . Kidney stone on right side       Past Medical History:     Past Medical History   Diagnosis Date   . Calculus of kidney    . Gastroesophageal reflux disease    . Rash      discoloration of skin at times   . History of ear infections as a child        Past Surgical History:     Past Surgical History   Procedure Laterality Date   . Skin tag removal  1996     from neck   . Wisdom tooth extraction  2000   . Eswl Left 10/02/2014     Procedure: ESWL;  Surgeon: Jeremy Johann, MD;  Location: [facility];  Service: Urology;  Laterality: Left;       Family History:     Family History   Problem Relation Age of Onset   . Colon cancer Neg Hx    . Malignant hyperthermia Neg Hx    . Pseudochol deficiency Neg Hx    . Anesthesia problems Neg Hx        Social History:     Social History     Social History   . Marital Status: Single     Spouse Name: N/A   . Number of Children: N/A   . Years of Education: N/A     Social History Main Topics   . Smoking status: Never Smoker    . Smokeless tobacco: Not on file   . Alcohol Use: Yes      Comment: socially   . Drug Use: No   . Sexual Activity: Not on file     Other Topics Concern   . Not on file     Social History Narrative       Allergies:   No Known Allergies    Medications:     Prescriptions prior to admission   Medication Sig   . oxyCODONE-acetaminophen (PERCOCET) 5-325 MG per tablet Take 1 tablet by mouth every 4 (four) hours as needed for Pain.      .  Prescriptions prior to admission   Medication Sig Dispense Refill Last Dose   . oxyCODONE-acetaminophen (PERCOCET) 5-325 MG per tablet Take 1 tablet by mouth every 4 (four) hours as needed for Pain.   11/20/2014 at Unknown time       Review of Systems:     Constitutional: negative for chills, fatigue, fevers and weight loss  Eyes: negative for redness and visual disturbance  Ears, nose, mouth, throat, and face: negative for epistaxis, nasal congestion and sore throat  Respiratory: negative for cough, dyspnea on exertion and sputum  Cardiovascular: negative for chest pain and palpitations  Gastrointestinal: right renal colic nausea and vomiting  Genitourinary:negative for dysuria and hematuria  Skin: negative for rash and skin lesion(s)  Hematologic/lymphatic: negative for  bleeding and easy bruising  Musculoskeletal:negative for back pain and bone pain  Endocrine: negative for diabetic symptoms including polyuria and pruritus      Physical Exam:     Filed Vitals:    11/20/14 0900   BP: 105/68   Pulse: 61   Temp: 98.5 F (36.9 C)   Resp: 18   SpO2: 94%       Intake and Output Summary (Last 24 hours) at Date Time    Intake/Output Summary (Last 24 hours) at 11/20/14 1625  Last data filed at 11/20/14 1137   Gross per 24 hour   Intake      0 ml   Output      0 ml   Net      0 ml       General appearance - alert, well appearing, and in no distress and oriented to person, place, and time  Mental status - alert, oriented to person, place, and time  Eyes - pupils equal and reactive, extraocular eye movements intact  Ears - hearing grossly normal bilaterally  Nose - normal and patent, no erythema, discharge or polyps  Mouth - mucous membranes moist, pharynx normal without lesions  Neck - supple, no significant JVD  Lymphatics - no palpable lymphadenopathy, no hepatosplenomegaly  Chest - clear in auscultation,anterior /posterior. No rales or rhonchi  Heart - normal rate, regular rhythm, normal S1, S2, no murmurs  appreciated  Abdomen - soft, nontender, nondistended, no masses or organomegaly  GU Male -normal phallis , bilateral descended testicles. No masses or tenderness;   Neurological - alert, oriented,no evidence of neurological  Deficits.  Musculoskeletal - no joint tenderness, deformity or swelling  Extremities - peripheral pulses normal, no pedal edema,no venous stasis  Skin - normal coloration and turgor, worm  And no rashes  Labs:     Results     Procedure Component Value Units Date/Time    Magnesium [[id number]] Collected:  11/20/14 1540    Specimen Information:  Blood Updated:  11/20/14 1623    Phosphorus [[id number]] Collected:  11/20/14 1540    Specimen Information:  Blood Updated:  11/20/14 1623    Hepatitis C (HCV) antibody, Total [[id number]] Collected:  11/20/14 1541    Specimen Information:  Blood Updated:  11/20/14 1541    Hepatitis B (HBV) Surface Antigen [[id number]] Collected:  11/20/14 1541    Specimen Information:  Blood Updated:  11/20/14 1541    Urine culture [[id number]] Collected:  11/20/14 1540    Specimen Information:  Urine from Urine, Clean Catch Updated:  11/20/14 1540          Imaging/Radiology:   Ct Abd/ Pelvis Without Contrast    11/20/2014  INDICATION: Right back pain. COMPARISON: None. TECHNIQUE: Noncontrast helical axial CT using 2.5 mm slices through the abdomen and pelvis in soft tissue window with coronal and sagittal reconstructions. FINDINGS: Right-sided nephrolithiasis. There is an obstructing calculus in the midproximal right ureter measuring 8 x 6 mm transaxially by 9 mm in cc dimension, with upstream at least mild to moderate hydronephrosis and hydroureter. The urinary bladder is collapsed. The prostate is not enlarged. Unremarkable liver, gallbladder, spleen, adrenal glands, left kidney, pancreas, GI tract, abdominal aorta, osseous structures, soft tissues. No small or large bowel obstruction. Stool throughout the colon suggesting constipation. No appendicitis (axial image 142). No  free fluid or free air. No lymphadenopathy.     11/20/2014   Right-sided obstructive uropathy as described in the body of the report (axial image  93). Tristan Sells, MD 11/20/2014 6:01 AM         Assessment:   Right hydronephrosis   Right ureteral stone proximal   Renal colic       Plan:   Discussed with patient willl schedule today for Right ESWL, Patient declined Endoscopy approach       Signed by: Prescilla Sours, MD

## 2014-11-20 NOTE — Progress Notes (Signed)
The patient and or patient's representative were informed both verbally and in writing that they are placed in Observation/Outpatient status.  A copy of the written notification was placed in the shadow chart.  Tristan Barnes Ext [phone]

## 2014-11-20 NOTE — H&P (Signed)
[facility]      Patient: Tristan Barnes  Date: 11/20/2014   DOB: 16-Jul-1980  Admission Date: 11/20/2014   MRN: [id number]  Attending: Cathe Mons         Chief Complain : right loin pain    History Gathered From: Patient     HISTORY AND PHYSICAL     Tristan Barnes is a 34 y.o. male with PMHx of Kidney stones S/P Lithotripsy last month who was brought to [facility] ED after he started to C/O of right  loin pain that started yesterday  , He described the pain as dull , 9/10 in severity when it started , no radiation , no aggravating or relieving factors . He denied Hematuria , dysuria , polyuria , fever , nausea or vomiting     Past Medical History   Diagnosis Date   . Calculus of kidney    . Gastroesophageal reflux disease    . Rash      discoloration of skin at times   . History of ear infections as a child        Past Surgical History   Procedure Laterality Date   . Skin tag removal  1996     from neck   . Wisdom tooth extraction  2000   . Eswl Left 10/02/2014     Procedure: ESWL;  Surgeon: Jeremy Johann, MD;  Location: [facility];  Service: Urology;  Laterality: Left;       Prior to Admission medications    Medication Sig Start Date End Date Taking? Authorizing Provider   oxyCODONE-acetaminophen (PERCOCET) 5-325 MG per tablet Take 1 tablet by mouth every 4 (four) hours as needed for Pain.   Yes [provider]   HYDROcodone-acetaminophen (NORCO) 5-325 MG per tablet Take 1-2 tablets by mouth every 4 (four) hours as needed for Pain. 09/27/14 11/20/14  Kennith Maes, MD   ibuprofen (ADVIL,MOTRIN) 600 MG tablet Take 1 tablet (600 mg total) by mouth every 6 (six) hours as needed for Pain. 09/27/14 11/20/14  Kennith Maes, MD       No Known Allergies    CODE STATUS: Full Code     PRIMARY CARE MD: Pcp, Noneorunknown, MD    Family History   Problem Relation Age of Onset   . Colon cancer Neg Hx    . Malignant hyperthermia Neg Hx    . Pseudochol deficiency Neg Hx    . Anesthesia problems Neg  Hx        Social History   Substance Use Topics   . Smoking status: Never Smoker    . Smokeless tobacco: Not on file   . Alcohol Use: Yes      Comment: socially       REVIEW OF SYSTEMS   Positive for: Right loin pain   Negative for: Hematuria , dysuria , fever   All ROS completed and otherwise negative.    PHYSICAL EXAM     Vital Signs (most recent): BP 105/68 mmHg  Pulse 61  Temp(Src) 98.5 F (36.9 C) (Oral)  Resp 18  Ht 1.676 m (5\' 6" )  Wt 90.719 kg (200 lb)  BMI 32.30 kg/m2  SpO2 94%  Constitutional: No apparent distress. Patient speaks freely in full sentences.   HEENT: NC/AT, PERRL, no scleral icterus or conjunctival pallor, no nasal discharge, MMM, oropharynx without erythema or exudate  Neck: trachea midline, supple, no cervical or supraclavicular lymphadenopathy or masses  Cardiovascular: RRR,  normal S1 S2, no murmurs, gallops, palpable thrills, no JVD, Non-displaced PMI.  Respiratory: Normal rate. No retractions or increased work of breathing. Clear to auscultation and percussion bilaterally.  Gastrointestinal: Left loin tenderness   Genitourinary: no suprapubic or costovertebral angle tenderness  Musculoskeletal: ROM and motor strength grossly normal. No clubbing, edema, or cyanosis. DP and radial pulses 2+ and symmetric.  Skin exam:  no pallor  Neurologic: EOMI, CN 2-12 grossly intact. no gross motor or sensory deficits  Psychiatric: AAOx3, affect and mood appropriate. The patient is alert, interactive, appropriate.  Capillary refill:  Normal    Exam done by Cathe Mons, MD on 11/20/2014 at 2:38 PM      LABS & IMAGING     Recent Results (from the past 24 hour(s))   CBC with Differential    Collection Time: 11/20/14  5:30 AM   Result Value Ref Range    WBC 8.03 3.50 - 10.80 x10 3/uL    Hgb 15.9 13.0 - 17.0 g/dL    Hematocrit 16.1 09.6 - 52.0 %    Platelets 274 140 - 400 x10 3/uL    RBC 5.33 4.70 - 6.00 x10 6/uL    MCV 91.2 80.0 - 100.0 fL    MCH 29.8 28.0 - 32.0 pg    MCHC 32.7 32.0 - 36.0  g/dL    RDW 13 12 - 15 %    MPV 10.3 9.4 - 12.3 fL    Neutrophils 64 None %    Lymphocytes Automated 24 None %    Monocytes 9 None %    Eosinophils Automated 3 None %    Basophils Automated 0 None %    Immature Granulocyte Unmeasured None %    Nucleated RBC Unmeasured 0 - 1 /100 WBC    Neutrophils Absolute 5.13 1.80 - 8.10 x10 3/uL    Abs Lymph Automated 1.90 0.50 - 4.40 x10 3/uL    Abs Mono Automated 0.71 0.00 - 1.20 x10 3/uL    Abs Eos Automated 0.26 0.00 - 0.70 x10 3/uL    Absolute Baso Automated 0.03 0.00 - 0.20 x10 3/uL    Absolute Immature Granulocyte Unmeasured 0 x10 3/uL   Comprehensive Metabolic Panel (CMP)    Collection Time: 11/20/14  5:35 AM   Result Value Ref Range    Glucose 116 (H) 70 - 100 mg/dL    BUN 04.5 9.0 - 40.9 mg/dL    Creatinine 1.1 0.7 - 1.3 mg/dL    Sodium 811 914 - 782 mEq/L    Potassium 4.3 3.5 - 5.1 mEq/L    Chloride 105 100 - 111 mEq/L    CO2 20 (L) 22 - 29 mEq/L    Calcium 9.0 8.5 - 10.5 mg/dL    Protein, Total 7.0 6.0 - 8.3 g/dL    Albumin 3.9 3.5 - 5.0 g/dL    AST (SGOT) 40 (H) 5 - 34 U/L    ALT 90 (H) 0 - 55 U/L    Alkaline Phosphatase 76 38 - 106 U/L    Bilirubin, Total 1.8 (H) 0.2 - 1.2 mg/dL    Globulin 3.1 2.0 - 3.6 g/dL    Albumin/Globulin Ratio 1.3 0.9 - 2.2    Anion Gap 13.0 5.0 - 15.0   GFR    Collection Time: 11/20/14  5:35 AM   Result Value Ref Range    EGFR >60.0    Urinalysis    Collection Time: 11/20/14  5:41 AM   Result Value Ref Range  Urine Type Clean Catch     Color, UA Yellow Clear - Yellow    Clarity, UA Clear Clear - Hazy    Specific Gravity UA 1.030 1.001-1.035    Urine pH 5.0 5.0-8.0    Leukocyte Esterase, UA NEGATIVE Negative    Nitrite, UA NEGATIVE Negative    Protein, UR 30 (A) Negative    Glucose, UA NEGATIVE Negative    Ketones UA NEGATIVE Negative    Urobilinogen, UA 2.0 0.2-2.0 mg/dL    Bilirubin, UA NEGATIVE Negative    Blood, UA LARGE (A) Negative   Microscopic, Urine    Collection Time: 11/20/14  5:41 AM   Result Value Ref Range    RBC, UA 11 - 25  (A) 0 - 5 /hpf    WBC, UA 0 - 5 0 - 5 /hpf    Calcium Oxalate Crystals, UA Few (A) Occasional /hpf       MICROBIOLOGY:  Blood Culture:  Urine Culture:   Antibiotics Started:     IMAGING:  Upon my review: CT abdomen without contrast     CARDIAC:  EKG Interpretation (upon my review):      Markers:        EMERGENCY DEPARTMENT COURSE:  Orders Placed This Encounter   Procedures   . Urine culture   . Basic Metabolic Panel   . CBC with differential   . Magnesium   . Phosphorus   . Hemolysis index   . Diet NPO effective now   . Notify physician   . I/O   . Height   . Weight   . Skin assessment   . Intermittent Pneumatic Compression   . Education: Activity   . Education: Disease Process & Condition   . Education: Pain Management   . Education: Falls Risk   . Education: Smoking Cessation   . Vital signs   . Up as tolerated   . Full Code   . Place (admit) for Observation Services       ASSESSMENT & PLAN     Tristan Barnes is a 34 y.o. male [facility] for right kidney stones     Patient Active Hospital Problem List:   1. Kidney stone on right side : 8X6 mm in size , in the midproximal right ureter with mild to moderate hydronephrosis and hydroureter :   - Urology consulted and planned for Lithotripsy today   - Keep patient NPO for the procedure   - Morphine 4mg  Q4hrs   - IV fluids   - Monitor RFT     2. Transaminitis : No hx of Alcoholism , Likely fatty liver diease R/O viral hepatitis   -  Viral Hepatitis Panel   - Liver ULT   - Monitor LFTs   - Consider            Nutrition  NPO     DVT/VTE Prophylaxis  Lovenox     Anticipated medical stability for discharge: tomorrow     Service status/Reason for ongoing hospitalization:   Anticipated Discharge Needs:     Tristan Barnes    11/20/2014 2:38 PM  Time Elapsed: > 30 mins

## 2014-11-20 NOTE — Progress Notes (Signed)
Pt returned from lithotripsy without issue. Urine strainer at bedside. Percocet given for pain. IVF running. Report given to Tobi Bastos, Charity fundraiser.

## 2014-11-20 NOTE — ED Notes (Signed)
Assumed patient care @ 0700.  Patient in no acute distress, rr even and unlabored, skin warm and dry. Patient has no complaints at this time.  [facility] hospital called, bed received from nursing supervisor [place].  Report called and given to [facility] @ [phone].

## 2014-11-20 NOTE — Plan of Care (Signed)
Problem: Health Promotion  Goal: Knowledge - disease process  Extent of understanding conveyed about a specific disease process.   Outcome: Progressing  Pt remains alert and oriented x 4. Right flank discomfort continues, PRN Morphine IV helping per pt. Pt NPO for possible lithotripsy. Independent in the room.     The patient's learning abilities have been assessed. Today's individualized plan of care to do hourly rounding, assist patient to bathroom if needed, assess O2 level, monitor HR, weight the patient, assess the patient was discussed with the patient and care giver and agree to it. Patient and care giver demonstrates understanding of disease process, treatment plan, medications and consequences of noncompliance. All questions and concerns were addressed.        Problem: Safety  Goal: Patient will be free from injury during hospitalization  Outcome: Progressing  Pt educated on fall precautions with understanding. Pt is able to ambulate independantly. Hourly rounding continues, encouraged to call for assistance as needed. Pt will remain free from falls. Callbell within reach at all times. Will continue to monitor.        Problem: Renal Calculi  Goal: Verbalizes understanding of signs/symptoms of illness  Outcome: Progressing  Goal: Fluid and electrolyte balance are achieved/maintained  Outcome: Progressing  Continue IVF, monitor labs  Goal: Hemodynamic Stability  Outcome: Progressing  Goal: Pain control  Patient will demonstrate personal actions to control pain.  Outcome: Progressing  Pt's pain is effectively managed with current regimen available. Pt's pain at adequate level as identified by pt. Non-pharmacological interventions, repositioning and rest provided. Pt comfort function goal is maintained. Will continue to monitor.

## 2014-11-20 NOTE — ED Notes (Signed)
Alert orientedx4 ambulatory skin warm and dry no sob , c/o of pain right lower back pain , believes it is kiney stone started at 0230 had percocet 1 tablet at about 0245.

## 2014-11-20 NOTE — ED Provider Notes (Signed)
Physician/Midlevel provider first contact with patient: 11/20/14 1610           EMERGENCY DEPARTMENT NOTE    Physician/Midlevel provider first contact with patient: 11/20/14 0514         HISTORY OF PRESENT ILLNESS   Historian: Patient  Translator Used: None    34 y.o. male presents with R flank pain radiating to the lower abdomen similar to prior kidney stones. Pain is dull with intermittent sharp quality. He has associated nausea without vomiting. No f/c, dysuria, frequency, hematuria.     1. Location of symptoms: R flank  2. Onset of symptoms: This am  3. What was patient doing when symptoms started (Context): At rest  4. Severity: Moderate  5. Timing: Constant  6. Activities that worsen symptoms: None  7. Activities that improve symptoms: None  8. Quality: Dull, Sharp  9. Radiation of symptoms: To abdomen  10. Associated signs and Symptoms: Nausea   11. Are symptoms worsening? Y          MEDICAL HISTORY     Past Medical History:  Past Medical History   Diagnosis Date   . Calculus of kidney    . Gastroesophageal reflux disease    . Rash      discoloration of skin at times   . History of ear infections as a child        Past Surgical History:  Past Surgical History   Procedure Laterality Date   . Skin tag removal  1996     from neck   . Wisdom tooth extraction  2000   . Eswl Left 10/02/2014     Procedure: ESWL;  Surgeon: Jeremy Johann, MD;  Location: [facility];  Service: Urology;  Laterality: Left;       Social History:  Social History     Social History   . Marital Status: Single     Spouse Name: N/A   . Number of Children: N/A   . Years of Education: N/A     Occupational History   . Not on file.     Social History Main Topics   . Smoking status: Never Smoker    . Smokeless tobacco: Not on file   . Alcohol Use: Yes      Comment: socially   . Drug Use: No   . Sexual Activity: Not on file     Other Topics Concern   . Not on file     Social History Narrative       Family History:  Family History   Problem  Relation Age of Onset   . Colon cancer Neg Hx    . Malignant hyperthermia Neg Hx    . Pseudochol deficiency Neg Hx    . Anesthesia problems Neg Hx        Outpatient Medication:  Discharge Medication List as of 11/20/2014  7:38 AM      CONTINUE these medications which have NOT CHANGED    Details   oxyCODONE-acetaminophen (PERCOCET) 5-325 MG per tablet Take 1 tablet by mouth every 4 (four) hours as needed for Pain., Until Discontinued, Historical Med             Allergies:  No Known Allergies      REVIEW OF SYSTEMS   Review of Systems   Constitutional: Negative for fever and chills.   Gastrointestinal: Positive for nausea and abdominal pain. Negative for vomiting and diarrhea.   Genitourinary: Positive for flank pain. Negative for  dysuria, urgency, frequency and hematuria.        Hesitency   All other systems reviewed and are negative.        PHYSICAL EXAM     Filed Vitals:    11/20/14 0515 11/20/14 0633 11/20/14 0727   BP: 139/87 133/76 128/82   Pulse: 86 65 61   Temp: 98.2 F (36.8 C)     Resp: 21 20 16    Height: 5\' 7"  (1.702 m)     Weight: 90.719 kg     SpO2: 95% 96% 100%         Nursing note and vitals reviewed.  Constitutional:  Well developed, well nourished. Awake & Oriented x3. Painful distress  Head:  Atraumatic. Normocephalic.    Eyes:  PERRL. EOMI. Conjunctivae are not pale.  ENT:  Mucous membranes are moist and intact. Oropharynx is clear and symmetric.  Patent airway.  Neck:  Supple. Full ROM.    Cardiovascular:  Regular rate. Regular rhythm. No murmurs, rubs, or gallops.  Pulmonary/Chest:  No evidence of respiratory distress. Clear to auscultation bilaterally.  No wheezing, rales or rhonchi.   Abdominal:  Soft and non-distended. There is R sided abdominal tenderness. No rebound, guarding, or rigidity.  Back:  Full ROM. Mild R CVA TTP  Extremities:  No edema. No cyanosis. No clubbing. Full range of motion in all extremities.  Skin:  Skin is warm and dry.   No rash.   Neurological:  Alert, awake, and  appropriate. Normal speech. Motor normal.  Psychiatric:  Good eye contact. Normal interaction, affect, and behavior.      MEDICAL DECISION MAKING       Pt reports history of kidney stones with similar symptoms in the past. Given morphine/toradol for pain. IVF for hydration. CTAP noncontrast performed.   CT reveals obstructive uropathy, R sided mid ureter stone 6x61mm  He reports persistent pain/discomfort. Second dose of Morphine given for pain. Call placed to urology and Case discussed with Dr Karel Jarvis  Requests admission to [facility] for lithotripsy. Pt agrees to plan    DISCUSSION      Vital Signs: Reviewed the patient?s vital signs.   Nursing Notes: Reviewed and utilized available nursing notes.  Medical Records Reviewed: Reviewed available past medical records.  Counseling: The emergency provider has spoken with the patient and discussed today?s findings, in addition to providing specific details for the plan of care.  Questions are answered and there is agreement with the plan.    IMAGING STUDIES    The following imaging studies were independently interpreted by the Emergency Medicine Physician.  For full imaging study results please see chart.    CT Abd/ Pelvis without Contrast (Final result)      Result time: 11/20/14 06:01:53     Final result by Shona Needles, MD (11/20/14 06:01:53)    Impression:     Right-sided obstructive uropathy as described in the body of  the report (axial image 93).    Aleen Sells, MD   11/20/2014 6:01 AM       Narrative:     INDICATION: Right back pain.    COMPARISON: None.    TECHNIQUE: Noncontrast helical axial CT using 2.5 mm slices through the  abdomen and pelvis in soft tissue window with coronal and sagittal  reconstructions.    FINDINGS: Right-sided nephrolithiasis. There is an obstructing calculus  in the midproximal right ureter measuring 8 x 6 mm transaxially by 9 mm  in cc dimension, with upstream at least  mild to moderate hydronephrosis  and  hydroureter. The urinary bladder is collapsed. The prostate is not  enlarged.    Unremarkable liver, gallbladder, spleen, adrenal glands, left kidney,  pancreas, GI tract, abdominal aorta, osseous structures, soft tissues.    No small or large bowel obstruction. Stool throughout the colon  suggesting constipation.    No appendicitis (axial image 142).    No free fluid or free air.    No lymphadenopathy.             CARDIAC STUDIES     The following cardiac studies were independently interpreted by the Emergency Medicine Physician. For full cardiac study results please see chart       PULSE OXIMETRY    Oxygen Saturation by Pulse Oximetry: 100%  Interventions:   Interpretation: Normal    EMERGENCY DEPT. MEDICATIONS      ED Medication Orders     Start Ordered     Status Ordering Provider    11/20/14 [id number] 11/20/14 0623  morphine injection 6 mg   Once     Route: Intravenous  Ordered Dose: 6 mg     Last MAR action:  Given Raliegh Ip    11/20/14 0540 11/20/14 0539  ondansetron (ZOFRAN) injection 4 mg   Once     Route: Intravenous  Ordered Dose: 4 mg     Last MAR action:  Given Raliegh Ip    11/20/14 0526 11/20/14 0525  ketorolac (TORADOL) injection 30 mg   Once     Route: Intravenous  Ordered Dose: 30 mg     Last MAR action:  Given Raliegh Ip    11/20/14 0525 11/20/14 0525  morphine injection 6 mg   Once     Route: Intravenous  Ordered Dose: 6 mg     Last MAR action:  Given Raliegh Ip    11/20/14 0524 11/20/14 0523  sodium chloride 0.9 % bolus 1,000 mL   Once     Route: Intravenous  Ordered Dose: 1,000 mL     Last MAR action:  Stopped Kenlee Vogt YVONNE          LABORATORY RESULTS    Ordered and independently interpreted AVAILABLE laboratory tests. Please see results section in chart for full details.  Results for orders placed or performed during the hospital encounter of 11/20/14   Urinalysis   Result Value Ref Range    Urine Type Clean Catch     Color, UA Yellow Clear  - Yellow    Clarity, UA Clear Clear - Hazy    Specific Gravity UA 1.030 1.001-1.035    Urine pH 5.0 5.0-8.0    Leukocyte Esterase, UA NEGATIVE Negative    Nitrite, UA NEGATIVE Negative    Protein, UR 30 (A) Negative    Glucose, UA NEGATIVE Negative    Ketones UA NEGATIVE Negative    Urobilinogen, UA 2.0 0.2-2.0 mg/dL    Bilirubin, UA NEGATIVE Negative    Blood, UA LARGE (A) Negative   CBC with Differential   Result Value Ref Range    WBC 8.03 3.50 - 10.80 x10 3/uL    Hgb 15.9 13.0 - 17.0 g/dL    Hematocrit 81.1 91.4 - 52.0 %    Platelets 274 140 - 400 x10 3/uL    RBC 5.33 4.70 - 6.00 x10 6/uL    MCV 91.2 80.0 - 100.0 fL    MCH 29.8 28.0 - 32.0 pg    MCHC 32.7 32.0 - 36.0  g/dL    RDW 13 12 - 15 %    MPV 10.3 9.4 - 12.3 fL    Neutrophils 64 None %    Lymphocytes Automated 24 None %    Monocytes 9 None %    Eosinophils Automated 3 None %    Basophils Automated 0 None %    Immature Granulocyte Unmeasured None %    Nucleated RBC Unmeasured 0 - 1 /100 WBC    Neutrophils Absolute 5.13 1.80 - 8.10 x10 3/uL    Abs Lymph Automated 1.90 0.50 - 4.40 x10 3/uL    Abs Mono Automated 0.71 0.00 - 1.20 x10 3/uL    Abs Eos Automated 0.26 0.00 - 0.70 x10 3/uL    Absolute Baso Automated 0.03 0.00 - 0.20 x10 3/uL    Absolute Immature Granulocyte Unmeasured 0 x10 3/uL   Comprehensive Metabolic Panel (CMP)   Result Value Ref Range    Glucose 116 (H) 70 - 100 mg/dL    BUN 16.1 9.0 - 09.6 mg/dL    Creatinine 1.1 0.7 - 1.3 mg/dL    Sodium 045 409 - 811 mEq/L    Potassium 4.3 3.5 - 5.1 mEq/L    Chloride 105 100 - 111 mEq/L    CO2 20 (L) 22 - 29 mEq/L    Calcium 9.0 8.5 - 10.5 mg/dL    Protein, Total 7.0 6.0 - 8.3 g/dL    Albumin 3.9 3.5 - 5.0 g/dL    AST (SGOT) 40 (H) 5 - 34 U/L    ALT 90 (H) 0 - 55 U/L    Alkaline Phosphatase 76 38 - 106 U/L    Bilirubin, Total 1.8 (H) 0.2 - 1.2 mg/dL    Globulin 3.1 2.0 - 3.6 g/dL    Albumin/Globulin Ratio 1.3 0.9 - 2.2    Anion Gap 13.0 5.0 - 15.0   GFR   Result Value Ref Range    EGFR >60.0    Microscopic,  Urine   Result Value Ref Range    RBC, UA 11 - 25 (A) 0 - 5 /hpf    WBC, UA 0 - 5 0 - 5 /hpf    Calcium Oxalate Crystals, UA Few (A) Occasional /hpf       CONSULTATIONS        CRITICAL CARE        ATTESTATIONS        Physician Attestation: I, Dr Marnee Spring DO, have been the primary provider for Tristan Barnes during this Emergency Dept visit and have reviewed the chart for accuracy and agree with its content.       DIAGNOSIS      Diagnosis:  Final diagnoses:   Obstructive uropathy       Disposition:  ED Disposition     Transfer to Another Facility Ismael Karge should be transferred out to [facility]   Admitting: Dr Jola Baptist  Consult Urology: Dr Karel Jarvis            Prescriptions:  Discharge Medication List as of 11/20/2014  7:38 AM      CONTINUE these medications which have NOT CHANGED    Details   oxyCODONE-acetaminophen (PERCOCET) 5-325 MG per tablet Take 1 tablet by mouth every 4 (four) hours as needed for Pain., Until Discontinued, Historical Med               Raliegh Ip, DO  11/21/14 [phone]

## 2014-11-20 NOTE — Plan of Care (Signed)
Problem: Renal Calculi  Goal: Verbalizes understanding of signs/symptoms of illness  Outcome: Progressing  S/p lithrotripsy today pt states that urine is bloody but is improving. Pain has also improved. No complaints at this time. VSS. Pt educated on importance of straining all urine. Continue to monitor.

## 2014-11-20 NOTE — Transfer of Care (Signed)
Anesthesia Transfer of Care Note    Patient: Tristan Barnes    Procedures performed: Procedure(s) with comments:  ESWL - ADD ON CASE      Anesthesia type: General LMA    Patient location:Phase I PACU    Last vitals:   Filed Vitals:    11/20/14 1824   BP: 130/74   Pulse: 84   Temp: 36.7 C (98.1 F)   Resp: 16   SpO2: 96%       Post pain: Patient not complaining of pain, continue current therapy      Mental Status:lethargic    Respiratory Function: tolerating nasal cannula    Cardiovascular: stable    Nausea/Vomiting: patient not complaining of nausea or vomiting    Hydration Status: adequate    Post assessment: no apparent anesthetic complications, no reportable events and no evidence of recall     Report to rn, vss

## 2014-11-20 NOTE — Anesthesia Preprocedure Evaluation (Addendum)
Anesthesia Evaluation    AIRWAY    Mallampati: II    TM distance: >3 FB  Neck ROM: full  Mouth Opening:full   CARDIOVASCULAR    cardiovascular exam normal       DENTAL         PULMONARY    pulmonary exam normal     OTHER FINDINGS                      Anesthesia Plan    ASA 2     general                     intravenous induction   Detailed anesthesia plan: general LMA        Post op pain management: per surgeon        Plan discussed with CRNA.

## 2014-11-21 ENCOUNTER — Encounter: Payer: Self-pay | Admitting: Urology

## 2014-11-21 MED ORDER — TAMSULOSIN HCL 0.4 MG PO CAPS
0.4000 mg | ORAL_CAPSULE | Freq: Every day | ORAL | Status: AC
Start: 2014-11-21 — End: 2014-12-21

## 2014-11-21 MED ORDER — OXYCODONE-ACETAMINOPHEN 5-325 MG PO TABS
1.0000 | ORAL_TABLET | ORAL | Status: DC | PRN
Start: 2014-11-21 — End: 2017-05-05

## 2014-11-21 NOTE — UM Notes (Signed)
INS:  [facility]    ADMIT DX:     HPI:34 y.o. male presents with R flank pain radiating to the lower abdomen similar to prior kidney stones. Pain is dull with intermittent sharp quality. He has associated nausea without vomiting. No f/c, dysuria, frequency, hematuria.     Diagnostic info:  CT Abd/ Pelvis without Contrast   Right-sided nephrolithiasis. There is an obstructing calculus  in the midproximal right ureter measuring 8 x 6 mm transaxially by 9 mm  in cc dimension, with upstream at least mild to moderate hydronephrosis  and hydroureter. The urinary bladder is collapsed. The prostate is not  enlarged.    ED TX:    11/20/14 0540 11/20/14 0539 ondansetron (ZOFRAN) injection 4 mg Once    Route: Intravenous Ordered Dose: 4 mg       11/20/14 0526 11/20/14 0525 ketorolac (TORADOL) injection 30 mg Once    Route: Intravenous Ordered Dose: 30 mg       11/20/14 0525 11/20/14 0525 morphine injection 6 mg Once    Route: Intravenous Ordered Dose: 6 mg       11/20/14 0524 11/20/14 0523 sodium chloride 0.9 % bolus 1,000 mL Once    Route: Intravenous Ordered Dose: 1,000 mL                 CP[facility] STATUS ORDER:Place (admit) for Observation Services (Order [id number])  11/20/2014  0914    ADMISSION ORDERS/PLAN OF CARE:  Patient Active Hospital Problem List:  1. Kidney stone on right side : 8X6 mm in size , in the midproximal right ureter with mild to moderate hydronephrosis and hydroureter :   - Urology consulted and planned for Lithotripsy today   - Keep patient NPO for the procedure   - Morphine 4mg  Q4hrs   - IV fluids   - Monitor RFT     2. Transaminitis : No hx of Alcoholism , Likely fatty liver diease R/O viral hepatitis   - Viral Hepatitis Panel   - Liver ULT   - Monitor LFTs   - Consider     Nutrition  NPO     DVT/VTE Prophylaxis  Lovenox     UROLOGY CONSULT:  Discussed with patient willl schedule today for Right ESWL, Patient declined Endoscopy approach     BRIEF OP NOTE:  Patient  was given ancef 1 gm preoperatively.Plaine abdominal X- Ray was reviewd with the patient then patient was brought to the operating room and placed on the table in the supine position. Patient was then placed under general anesthesia via LMA. The stone was then identified under flouroscopy. The stone was treated with 3000 shocks at maximum power of 20 KV. The stone appeared to be fragment under fluoroscopic views by the end of the procedure.The patient was then transferred to the recovery room in stable condition without complications.     11/21/2014  UROLOGY PROGRESS NOTE:  Plan:   Discharge home   Follow up in 4 weeks        Patient discharged home. Medications and discharge instructions reviewed. Pt able to teach back medication and d/c instructions. IV removed. All questions answered at this time.

## 2014-11-21 NOTE — Discharge Instr - Diet (Signed)
As tolerated

## 2014-11-21 NOTE — Progress Notes (Signed)
PROGRESS NOTE    Date Time: 11/21/2014 8:33 AM  Patient Name: Tristan Barnes,Tristan Barnes        Subjective:   Feeling better , passed few stone fragments     Objective:     Filed Vitals:    11/21/14 0530   BP: 99/55   Pulse: 59   Temp: 96 F (35.6 C)   Resp: 18   SpO2: 95%       Intake and Output Summary (Last 24 hours) at Date Time    Intake/Output Summary (Last 24 hours) at 11/21/14 1610  Last data filed at 11/21/14 0600   Gross per 24 hour   Intake   2485 ml   Output      0 ml   Net   2485 ml         Physical Exam:     General appearance - alert, well appearing, and in no distress    Chest - clear to auscultation, no wheezes, rales or rhonchi, symmetric air entry    Heart - normal rate, regular rhythm, normal S1, S2, no murmurs, rubs, clicks or gallops    Abdomen - soft, nontender, nondistended, no masses or organomegaly      Ct Abd/ Pelvis Without Contrast    11/20/2014  INDICATION: Right back pain. COMPARISON: None. TECHNIQUE: Noncontrast helical axial CT using 2.5 mm slices through the abdomen and pelvis in soft tissue window with coronal and sagittal reconstructions. FINDINGS: Right-sided nephrolithiasis. There is an obstructing calculus in the midproximal right ureter measuring 8 x 6 mm transaxially by 9 mm in cc dimension, with upstream at least mild to moderate hydronephrosis and hydroureter. The urinary bladder is collapsed. The prostate is not enlarged. Unremarkable liver, gallbladder, spleen, adrenal glands, left kidney, pancreas, GI tract, abdominal aorta, osseous structures, soft tissues. No small or large bowel obstruction. Stool throughout the colon suggesting constipation. No appendicitis (axial image 142). No free fluid or free air. No lymphadenopathy.     11/20/2014   Right-sided obstructive uropathy as described in the body of the report (axial image 93). Aleen Sells, MD 11/20/2014 6:01 AM     Xr Abdomen Ap    11/20/2014  XR ABDOMEN AP CLINICAL INDICATION:   ureteral stone, pr op COMPARISON: Renal stone CT of  the same date. FINDINGS:       An 8 mm nodular calcification projecting over the right paraspinal region at the L1 level likely corresponds to the previously identified right ureteral stone. Nonobstructing right renal stones on the prior CT are not definitively seen on the current radiograph. Small nodular calcification projecting over the lower pelvis just right of midline likely represents a calcified phlebolith. The bowel gas pattern appears nonobstructive.     11/20/2014   An 8 mm nodular calcification projecting over the right paraspinal region at L1 likely corresponds to the previously identified right ureteral stone. Sandie Ano, MD 11/20/2014 4:23 PM         Assessment:   <principal problem not specified>      Plan:   Discharge home   Follow up in 4 weeks      Signed by: Prescilla Sours, MD

## 2014-11-21 NOTE — Progress Notes (Signed)
Pt passing renal calculi, stones sent to the lab, awaiting D/C. No new issues noted at this time, denies pain.

## 2014-11-21 NOTE — Discharge Instructions (Signed)
Kidney Stone (with Pain)    The sharp cramping pain on either side of your lower back and nausea/vomiting that you have are because ofa small stone that has formed in the kidney. It is now passing down a narrow tube (ureter) on its way to your bladder. Once the stone reaches your bladder, the pain will often stop. But it may come back as the stone continues to pass out of the bladder and through the urethra. The stone may pass in your urine stream in one piece. The size may be 1/16 inch to 1/4 inch (1 to 6 mm). Or, the stone may break up into sandy fragments that you may not even notice.  Once you have had a kidney stone, you are at risk of getting another one in the future. There are 4 types of kidney stones. Eighty percent are calcium stones--mostly calcium oxalate but also some with calcium phosphate. The other 3 types include uric acid stones, struvite stones (from a preceding infection), and rarely, cystine stones.  Most stones will pass on their own, but may take from a few hours to a few days. Sometimes the stone is too large to pass by itself. In that case, the health care provider will need to useother ways to remove the stone. These techniques include:   Lithotripsy. Thisuses ultrasound waves to break up the stone.   Ureteroscopy. Thispushes a basket-like instrument through the urethra and bladder and into the ureter to pull out the stone.   Various types of direct surgery through the skin  Home care  The following are general care guidelines:   Drink plenty of fluids. This means at least 12, 8-ounce glasses of fluid--mostly water--a day.   Each time you urinate, do so in a jar. Pour the urine from the jar through the strainer and into the toilet. Continue doing this until 24 hours after your pain stops. By then, if there was a kidney stone, it should pass from your bladder. Some stones dissolve into sand-like particles and pass right through the strainer. In that case, you won't ever see a  stone.   Save any stone that you find in the strainer and bring it to your doctor to look at. It may be possible to stop certain types of stones from forming. For this reason, it is important to know what kind of stone you have.   Try to stay as active as possible. This will help the stone pass. Don't stay in bed unless your pain keeps you from getting up. You may notice a red, pink, or brown color to your urine. This is normal while passing a kidney stone.   If you develop pain, you may take ibuprofen or naproxen for pain, unless another medicine was prescribed.If you have chronic liver or kidney disease, talk with yourhealth care provider before taking these medicines. Also talk with your provider if you'vehad a stomach ulcer or GI bleeding.  Preventingstones  Each year for the next 5 to7 years,you are at risk thatanew stone will form. Your risk is a 50% chance over this time period.The risk is higherif you have a family history of kidney stones or have certain chronic illnesses like hypertension, obesity, ordiabetes.But you can makechanges to your lifestyle and diet that can lower yourrisk for another stone.  Most kidney stones are made of calcium. The following is advice for preventing anothercalcium stone.If you don't know the type of stone you have, follow this advice until the cause of your stone   is found.  Things that help:   The most important thing you can do is to drink plenty of fluids each day. See home care above.   Eat foods that contain phytates. These includewheat, rice, rye, barley, and beans. Phytates are substances that may lower your risk forany type of stone for form.   Eat more fruits and vegetables.Choose those that arehigh in potassium.   Eat foods high in natural citrate likefruit and low-sugar fruit juices.   Having too little calcium in your diet can put you at risk forcalciumkidney stones. Eat a normal amount of calcium in your diet and talkwith your  health care providerif you are taking calcium supplements. Cutting back on your calcium intake may raise your risk.New research shows that eating calcium-rich and oxalate-rich foods together lowers your risk for stones by binding the minerals in the stomach and intestines before they can reach the kidneys.    Limit salt intake to 2grams (1teaspoon) per day. Use limited amounts when cooking, and don't add salt at the table.Processed and canned foods are usually high in salt.   Spinach, rhubarb, peanuts, cashews, almonds, grapefruit, and grapefruit juice are all high oxalate foods. You should limit how much of these you eat. Oreat them withcalcium-rich foods. These include dairy products, dark leafy greens, soy products, and calcium-enriched foods.   Reducing the amount of animal meat and high protein foods in your diet may lower your riskof uric acid stones.   Avoidexcess sugar (sucrose) and fructose (sweetener in many soft drinks) inyourdiet.   If you take vitamin C as a supplement, don't take more than 1,000 mg a day.   A dietitian or your health provider can give youinformation aboutchanges in your diet that will help stop morekidney stone from forming.  Follow-up care  Follow up with yourhealth care provider, or as advised,if the pain lasts more than 48 hours. Talk with your provider about urine and blood tests to find out the cause of your stone. If you had an X-ray, CT scan, or other diagnostic test, it will be looked atby a specialist. You will be told of any new findings that may affect your care.  When to seek medical advice  Call your health care provider right away if any of these occur:   Pain that is not controlled by the medicine given   Repeated vomiting or unable to keep down fluids   Weakness, dizziness, or fainting   Fever of 100.69F (38C) or higher, or as directed by your health care provider   Passage of solid red or brown urine (can't see through it) or urine with  lots of blood clots   Foul-smelling or cloudy urine   Unable to pass urine for 8 hours and increasing bladder pressure   2000-2015 [employer] [address] [place], [place] [address]. All rights reserved. This information is not intended as a substitute for professional medical care. Always follow your healthcare professional's instructions.      [facility]   [facility]    DR. Arther Dames  [address], [address]  [place], [place]  [address]  [phone]  Post Operative Surgical Instructions     You may eat and drink what you like, but advance your diet from liquids to regular intake slowly.  If you feel nauseated, be sure to try liquids first and when these are tolerated, you may proceed ahead to a regular diet.  If you have excessive nausea and  vomiting, which does not promptly resolve, please call us.  You should keep your urine flowing freely and drink plenty of water, 8-10 glasses a day.  You may expect some spotting (blood in the urine) for 48-72 hours after your procedure.  This is not unusual and should clear quickly with bed rest and fluid intake.  Strain all urine for 3-4 days and save stone fragments to bring to the office for analysis upon your office visit  Use the pain medication as prescribed , you may experience severe pain for few days due to stone pssage  If you have discomfort, extra strength Tylenol may be used, of if we have prescribed any other medications, please be sure to follow the instructions on the bottle.  If you have a fever of more than 1oo degree, excessive pain or swelling, please call us promptly.  You may have bruise at the site of treatment will resolve spontaneously as well as blood in the urine for few days   The schedule at the hospital is a hectic one, and your surgeon wants to discuss all of the findings of your procedure with you.  Please make a follow-up appointment.  Obviously, any problems needing immediate attention  will be taken care of while you are at the hospital.  The doctor, if at all possible, will want to talk to you as soon as the operating schedule allows, and please be available in the patient waiting area.  IF you have missed connections post-operatively, please notify the volunteers so that the doctor knows you are waiting.  9.  please call the office for any questions that are not covered and that cannot wait  for our follow appointment           Post Anesthesia Discharge Instructions    Although you may be awake and alert in the recovery room, small amounts of anesthetic remain in your system for about 24 hours.  You may feel tired and sleepy during this time.      You are advised to go directly home from the hospital.    Plan to stay at home and rest for the remainder of the day.    It is advisable to have someone with you at home for 24 hours after surgery.    Do not operate a motor vehicle, or any mechanical or electrical equipment for the next 24 hours.      Be careful when you are walking around, you may become dizzy.  The effects of anesthesia and/or medications are still present and drowsiness may occur    Do not consume alcohol, tranquilizers, sleeping medications, or any other non prescribed medication for the remainder of the day.    Diet:  begin with liquids, progress your diet as tolerated or as directed by your surgeon.  Nausea and vomiting may occur in the next 24 hours.        Kidney Stone (with Pain)    The sharp cramping pain on either side of your lower back and nausea/vomiting that you have are because ofa small stone that has formed in the kidney. It is now passing down a narrow tube (ureter) on its way to your bladder. Once the stone reaches your bladder, the pain will often stop. But it may come back as the stone continues to pass out of the bladder and through the urethra. The stone may pass in your urine stream in one piece. The size may be 1/16 inch to 1/4 inch (1  to 6 mm). Or, the stone  may break up into sandy fragments that you may not even notice.  Once you have had a kidney stone, you are at risk of getting another one in the future. There are 4 types of kidney stones. Eighty percent are calcium stones--mostly calcium oxalate but also some with calcium phosphate. The other 3 types include uric acid stones, struvite stones (from a preceding infection), and rarely, cystine stones.  Most stones will pass on their own, but may take from a few hours to a few days. Sometimes the stone is too large to pass by itself. In that case, the health care provider will need to useother ways to remove the stone. These techniques include:   Lithotripsy. Thisuses ultrasound waves to break up the stone.   Ureteroscopy. Thispushes a basket-like instrument through the urethra and bladder and into the ureter to pull out the stone.   Various types of direct surgery through the skin  Home care  The following are general care guidelines:   Drink plenty of fluids. This means at least 12, 8-ounce glasses of fluid--mostly water--a day.   Each time you urinate, do so in a jar. Pour the urine from the jar through the strainer and into the toilet. Continue doing this until 24 hours after your pain stops. By then, if there was a kidney stone, it should pass from your bladder. Some stones dissolve into sand-like particles and pass right through the strainer. In that case, you won't ever see a stone.   Save any stone that you find in the strainer and bring it to your doctor to look at. It may be possible to stop certain types of stones from forming. For this reason, it is important to know what kind of stone you have.   Try to stay as active as possible. This will help the stone pass. Don't stay in bed unless your pain keeps you from getting up. You may notice a red, pink, or brown color to your urine. This is normal while passing a kidney stone.   If you develop pain, you may take ibuprofen or naproxen for pain,  unless another medicine was prescribed.If you have chronic liver or kidney disease, talk with yourhealth care provider before taking these medicines. Also talk with your provider if you'vehad a stomach ulcer or GI bleeding.  Preventingstones  Each year for the next 5 to7 years,you are at risk thatanew stone will form. Your risk is a 50% chance over this time period.The risk is higherif you have a family history of kidney stones or have certain chronic illnesses like hypertension, obesity, ordiabetes.But you can makechanges to your lifestyle and diet that can lower yourrisk for another stone.  Most kidney stones are made of calcium. The following is advice for preventing anothercalcium stone.If you don't know the type of stone you have, follow this advice until the cause of your stone is found.  Things that help:   The most important thing you can do is to drink plenty of fluids each day. See home care above.   Eat foods that contain phytates. These includewheat, rice, rye, barley, and beans. Phytates are substances that may lower your risk forany type of stone for form.   Eat more fruits and vegetables.Choose those that arehigh in potassium.   Eat foods high in natural citrate likefruit and low-sugar fruit juices.   Having too little calcium in your diet can put you at risk forcalciumkidney stones. Eat a normal  amount of calcium in your diet and talkwith your health care providerif you are taking calcium supplements. Cutting back on your calcium intake may raise your risk.New research shows that eating calcium-rich and oxalate-rich foods together lowers your risk for stones by binding the minerals in the stomach and intestines before they can reach the kidneys.    Limit salt intake to 2grams (1teaspoon) per day. Use limited amounts when cooking, and don't add salt at the table.Processed and canned foods are usually high in salt.   Spinach, rhubarb, peanuts, cashews, almonds,  grapefruit, and grapefruit juice are all high oxalate foods. You should limit how much of these you eat. Oreat them withcalcium-rich foods. These include dairy products, dark leafy greens, soy products, and calcium-enriched foods.   Reducing the amount of animal meat and high protein foods in your diet may lower your riskof uric acid stones.   Avoidexcess sugar (sucrose) and fructose (sweetener in many soft drinks) inyourdiet.   If you take vitamin C as a supplement, don't take more than 1,000 mg a day.   A dietitian or your health provider can give youinformation aboutchanges in your diet that will help stop morekidney stone from forming.  Follow-up care  Follow up with yourhealth care provider, or as advised,if the pain lasts more than 48 hours. Talk with your provider about urine and blood tests to find out the cause of your stone. If you had an X-ray, CT scan, or other diagnostic test, it will be looked atby a specialist. You will be told of any new findings that may affect your care.  When to seek medical advice  Call your health care provider right away if any of these occur:   Pain that is not controlled by the medicine given   Repeated vomiting or unable to keep down fluids   Weakness, dizziness, or fainting   Fever of 100.9F (38C) or higher, or as directed by your health care provider   Passage of solid red or brown urine (can't see through it) or urine with lots of blood clots   Foul-smelling or cloudy urine   Unable to pass urine for 8 hours and increasing bladder pressure   2000-2015 [employer] [address] [place], [place] [address]. All rights reserved. This information is not intended as a substitute for professional medical care. Always follow your healthcare professional's instructions.      [facility] DISCHARGE INSTRUCTIONS     Date of Admission: 11/20/2014    Date of Discharge: 11/21/2014    Discharge Physician: Cathe Mons    You were  admitted to [facility] for Right side kidney stones  <principal problem not specified>. It is important that you make your follow up appointments listed below and take your medications as prescribed.      If you are unable to obtain an appointment, unable to obtain newly prescribed medications, or are unclear about any of your discharge instructions please contact your discharging physician at [phone] (M-F, 8am-3pm) or weekends and after hours via the hospital operator [phone], hospital case manager, or your primary care physician.    Follow up Appointments  Follow-up Information     Follow up with Prescilla Sours, MD. Schedule an appointment as soon as possible for a visit in 1 week.    Specialty:  Urology    Contact information:    [address]  [place] [address]  [phone]  Follow up with Prescilla Sours, MD In 1 month.    Specialty:  Urology    Contact information:    [address]  [address]  [place] [address]  [phone]          Follow up with [facility] In 1 week.    Contact information:    [address]  [address]  [place] [address]  [phone]          Consultants: Urology     Pending Studies: None     Further Instructions: Encourage oral hydration     Discharge Diet: Regular     Surgery/Procedure: Lithotripsy

## 2014-11-21 NOTE — Discharge Instr - Activity (Signed)
As tolerated

## 2014-11-21 NOTE — Discharge Summary (Signed)
[facility]      Patient: Tristan Barnes  Admission Date: 11/20/2014   DOB: 25-Dec-1980  Discharge Date: 11/21/2014    MRN: [id number]  Discharge Attending:Lam Mccubbins Judie Petit Celestia Khat     Referring Physician: Christa See, MD  PCP: Christa See, MD       DISCHARGE SUMMARY     Discharge Information   Admission Diagnosis:   Right kidney stone     Discharge Diagnosis:   Active Hospital Problems    Diagnosis   . Kidney stone on right side        Admission Condition: Stable   Discharge Condition: Stable   Consultants: Urology   Functional Status: Dependent   Discharged to: Home     Discharge Medications:     Medication List      START taking these medications          tamsulosin 0.4 MG Caps   Commonly known as:  FLOMAX   Take 1 capsule (0.4 mg total) by mouth Daily after dinner.         CHANGE how you take these medications          oxyCODONE-acetaminophen 5-325 MG per tablet   Commonly known as:  PERCOCET   Take 1 tablet by mouth every 4 (four) hours as needed.   What changed:  reasons to take this            Where to Get Your Medications      You can get these medications from any pharmacy     Bring a paper prescription for each of these medications    - oxyCODONE-acetaminophen 5-325 MG per tablet  - tamsulosin 0.4 MG Caps              Hospital Course   Presentation History   Tristan Barnes is a 34 y.o. male with PMHx of Kidney stones S/P Lithotripsy last month who was brought to [facility] ED after he started to C/O of right loin pain that started yesterday , He described the pain as dull , 9/10 in severity when it started , no radiation , no aggravating or relieving factors . He denied Hematuria , dysuria , polyuria , fever , nausea or vomiting         Hospital Course (1 Days)   Tristan Barnes was found to have 8X6 mm right kidney stone . Patient had Lithotripsy on the day of admission and was monitored . Upon discharge he reported passing small stones and his pain has resolved     Procedures/Imaging:   Upon my  review: CT abdomen without contrast        Best Practices   Was the patient admitted with either a CHF Exacerbation or Pneumonia? No      Progress Note/Physical Exam at Discharge     Subjective: Denied abdominal pain or vomiting     Filed Vitals:    11/20/14 1900 11/20/14 2137 11/21/14 0530 11/21/14 0538   BP: 137/73 119/55 99/55    Pulse: 87 82 59    Temp:  98.1 F (36.7 C) 96 F (35.6 C)    TempSrc:  Oral Oral    Resp: 20 16 18     Height:       Weight:    116.574 kg (257 lb)   SpO2: 95% 93% 95%            General: NAD, AAOx3  HEENT: perrla, eomi, sclera anicteric, OP: Clear, MMM  Neck: supple, FROM,  no LAD  Cardiovascular: RRR, no m/r/g  Lungs: CTAB, no w/r/r  Abdomen: soft, +BS, NT/ND, no masses, no g/r  Extremities: no C/C/E  Skin: no rashes or lesions noted  Neuro: CN 2-12 intact; No Focal neurological deficits       Diagnostics     Labs/Studies Pending at Discharge: No    Last Labs     Recent Labs  Lab 11/20/14  0530   WBC 8.03   RBC 5.33   HGB 15.9   HEMATOCRIT 48.6   MCV 91.2   PLATELETS 274         Recent Labs  Lab 11/20/14  1540 11/20/14  0535   SODIUM  --  138   POTASSIUM  --  4.3   CHLORIDE  --  105   CO2  --  20*   BUN  --  19.0   CREATININE  --  1.1   GLUCOSE  --  116*   CALCIUM  --  9.0   MAGNESIUM 2.2  --              Microbiology Results     Procedure Component Value Units Date/Time    Urine culture [[id number]] Collected:  11/20/14 1540    Specimen Information:  Urine from Urine, Clean Catch Updated:  11/20/14 1930           Patient Instructions   Discharge Diet: Regular   Discharge Activity:    Follow Up Appointment:  Follow-up Information     Follow up with Prescilla Sours, MD. Schedule an appointment as soon as possible for a visit in 1 week.    Specialty:  Urology    Contact information:    [address]  [address]  [place] [address]  [phone]          Follow up with Prescilla Sours, MD In 1 month.    Specialty:  Urology    Contact information:    [address]  [address]  [place]  [address]  [phone]          Follow up with [facility] In 1 week.    Contact information:    [address]  [address]  [place] [address]  [phone]           Time spent examining patient, discussing with patient/family regarding hospital course, chart review, reconciling medications and discharge planning: > 30  minutes.    Brendolyn Patty    6:22 PM 11/21/2014

## 2014-11-21 NOTE — Progress Notes (Signed)
Patient discharged home. Medications and discharge instructions reviewed. Pt able to teach back medication and d/c instructions. IV removed. All questions answered at this time.

## 2017-02-03 ENCOUNTER — Emergency Department (HOSPITAL_COMMUNITY): Payer: PRIVATE HEALTH INSURANCE

## 2017-02-03 ENCOUNTER — Encounter (HOSPITAL_COMMUNITY): Payer: Self-pay | Admitting: Emergency Medicine

## 2017-02-03 ENCOUNTER — Other Ambulatory Visit: Payer: Self-pay

## 2017-02-03 ENCOUNTER — Emergency Department (HOSPITAL_COMMUNITY)
Admission: EM | Admit: 2017-02-03 | Discharge: 2017-02-03 | Disposition: A | Discharge status: Home / Self Care | Payer: PRIVATE HEALTH INSURANCE | Attending: Emergency Medicine | Admitting: Emergency Medicine

## 2017-02-03 DIAGNOSIS — N200 Calculus of kidney: Secondary | ICD-10-CM | POA: Diagnosis not present

## 2017-02-03 DIAGNOSIS — R1084 Generalized abdominal pain: Secondary | ICD-10-CM | POA: Diagnosis present

## 2017-02-03 HISTORY — DX: Calculus of kidney: N20.0

## 2017-02-03 LAB — I-STAT CHEM 8, ED
BUN: 16 mg/dL (ref 6–20)
CREATININE: 1.2 mg/dL (ref 0.61–1.24)
Calcium, Ion: 1.2 mmol/L (ref 1.15–1.40)
Chloride: 103 mmol/L (ref 101–111)
Glucose, Bld: 81 mg/dL (ref 65–99)
HEMATOCRIT: 49 % (ref 39.0–52.0)
Hemoglobin: 16.7 g/dL (ref 13.0–17.0)
POTASSIUM: 4.3 mmol/L (ref 3.5–5.1)
Sodium: 140 mmol/L (ref 135–145)
TCO2: 26 mmol/L (ref 22–32)

## 2017-02-03 LAB — URINALYSIS, ROUTINE W REFLEX MICROSCOPIC
Bilirubin Urine: NEGATIVE
Glucose, UA: NEGATIVE mg/dL
Ketones, ur: NEGATIVE mg/dL
Leukocytes, UA: NEGATIVE
Nitrite: NEGATIVE
Protein, ur: NEGATIVE mg/dL
Specific Gravity, Urine: >1.03 — ABNORMAL HIGH (ref 1.005–1.030)
pH: 5.5 (ref 5.0–8.0)

## 2017-02-03 LAB — URINALYSIS, MICROSCOPIC (REFLEX): Squamous Epithelial / HPF: NONE SEEN

## 2017-02-03 MED ORDER — OXYCODONE-ACETAMINOPHEN 5-325 MG PO TABS
1.0000 | ORAL_TABLET | Freq: Once | ORAL | Status: DC
  Filled 2017-02-03: qty 1

## 2017-02-03 MED ORDER — IBUPROFEN 800 MG PO TABS: 800.0000 mg | ORAL_TABLET | Freq: Three times a day (TID) | ORAL | 0 refills | Status: AC

## 2017-02-03 MED ORDER — OXYCODONE-ACETAMINOPHEN 5-325 MG PO TABS
1.0000 | ORAL_TABLET | ORAL | Status: DC | PRN
  Administered 2017-02-03: 1 via ORAL
  Filled 2017-02-03: qty 1

## 2017-02-03 MED ORDER — OXYCODONE-ACETAMINOPHEN 5-325 MG PO TABS: 2.0000 | ORAL_TABLET | ORAL | 0 refills | Status: AC | PRN

## 2017-02-03 MED ORDER — TAMSULOSIN HCL 0.4 MG PO CAPS: 0.4000 mg | ORAL_CAPSULE | Freq: Every day | ORAL | 0 refills | Status: AC

## 2017-02-03 MED ORDER — KETOROLAC TROMETHAMINE 60 MG/2ML IM SOLN
60.0000 mg | Freq: Once | INTRAMUSCULAR | Status: AC
  Administered 2017-02-03: 60 mg via INTRAMUSCULAR
  Filled 2017-02-03: qty 2

## 2017-02-03 MED ORDER — ONDANSETRON 4 MG PO TBDP
4.0000 mg | ORAL_TABLET | Freq: Once | ORAL | Status: AC
  Administered 2017-02-03: 4 mg via ORAL
  Filled 2017-02-03: qty 1

## 2017-02-03 NOTE — Discharge Instructions (Addendum)
You have 6mm x 4mm kidney stone in the right ureter.   Your lab work is reassuring. Kidney function looks good.   Please take 800 mg ibuprofen every 6 hours as needed for pain. You can also take percocet should you need it for breakthrough pain. This medicine can make you drowsy so please do not drive, work or drink alcohol while taking it.   Follow-up with your urologist when you return to [place] for your ER visit today.  Return to the emergency department if you have vomiting that will not stop, unable to urinate or have any new or worsening symptoms.

## 2017-02-03 NOTE — ED Triage Notes (Signed)
Pt reports R flank pain, denies dysuria. Pt reports hx kidney stones.

## 2017-02-03 NOTE — ED Provider Notes (Signed)
[facility] EMERGENCY DEPARTMENT Provider Note   CSN: [id number] Arrival date & time: 02/03/17  0856     History   Chief Complaint Chief Complaint  Patient presents with  . Flank Pain    HPI Steven Kelley is a 37 y.o. male.  HPI   Mr. Steven Kelley is a 37 year old male with a history of recurrent kidney stones who presents to the emergency department for evaluation of right-sided flank pain.  Patient states that pain began about 2 days ago and has gradually worsened.  Pain is located in the right lower back and radiates to the right groin.  He states that pain is dull and aching but occasionally becomes sharp and stabbing without any known trigger.  Pain is currently 8/10 in severity.  He states that it was improved when he was given Percocet a few hours ago in the waiting room.  He also has some nausea with the pain, no vomiting.  States that he lives in [place] and is visiting family, has a urologist back home.  Denies fevers, chills, dysuria, urinary frequency, difficulty voiding, abdominal pain, diarrhea, testicular pain/swelling, chest pain, shortness of breath.  Reports that he has had to have the stones broken up via lithotripsy several years ago.  Past Medical History:  Diagnosis Date  . Kidney stones     There are no active problems to display for this patient.   Past Surgical History:  Procedure Laterality Date  . LITHOTRIPSY         Home Medications    Prior to Admission medications   Not on File    Family History No family history on file.  Social History Social History   Tobacco Use  . Smoking status: Never Smoker  . Smokeless tobacco: Never Used  Substance Use Topics  . Alcohol use: No    Frequency: Never  . Drug use: No     Allergies   Patient has no allergy information on record.   Review of Systems Review of Systems   Physical Exam Updated Vital Signs BP (!) 140/94 (BP Location: Right Arm)   Pulse (!) 57   Temp 98.6 F  (37 C) (Oral)   Resp 18   SpO2 98%   Physical Exam  Constitutional: He is oriented to person, place, and time. He appears well-developed and well-nourished. No distress.  Patient sitting comfortably at bedside in no apparent distress.  HENT:  Head: Normocephalic and atraumatic.  Mouth/Throat: Oropharynx is clear and moist. No oropharyngeal exudate.  Mucous membranes moist.  Eyes: Conjunctivae are normal. Pupils are equal, round, and reactive to light. Right eye exhibits no discharge. Left eye exhibits no discharge.  Neck: Normal range of motion. Neck supple.  Cardiovascular: Normal rate and regular rhythm. Exam reveals no friction rub.  No murmur heard. Pulmonary/Chest: Effort normal and breath sounds normal. No stridor. No respiratory distress. He has no wheezes. He has no rales.  Abdominal: Soft. Bowel sounds are normal. There is no tenderness. There is no guarding.  No CVA tenderness.  Musculoskeletal:  Nontender to palpation over the lumbar spine or bilateral paraspinal muscles.  No tenderness over the flank area.  No bruising, rash, erythema or open wound.  Lymphadenopathy:    He has no cervical adenopathy.  Neurological: He is alert and oriented to person, place, and time. Coordination normal.  Gait normal and coordination and balance.  Skin: Skin is warm and dry. Capillary refill takes less than 2 seconds. He is not diaphoretic.  Psychiatric: He has a normal mood and affect. His behavior is normal.  Nursing note and vitals reviewed.    ED Treatments / Results  Labs (all labs ordered are listed, but only abnormal results are displayed) Labs Reviewed  URINALYSIS, ROUTINE W REFLEX MICROSCOPIC - Abnormal; Notable for the following components:      Result Value   Specific Gravity, Urine >1.030 (*)    Hgb urine dipstick MODERATE (*)    All other components within normal limits  URINALYSIS, MICROSCOPIC (REFLEX) - Abnormal; Notable for the following components:   Bacteria, UA  RARE (*)    All other components within normal limits  I-STAT CHEM 8, ED    EKG  EKG Interpretation None       Radiology [facility] Renal  Result Date: 02/03/2017 CLINICAL DATA:  Acute right flank pain. EXAM: RENAL / URINARY TRACT ULTRASOUND COMPLETE COMPARISON:  CT scan of same day. FINDINGS: Right Kidney: Length: 11.9 cm. Echogenicity within normal limits. No mass visualized. Mild right hydronephrosis is noted. Left Kidney: Length: 12.5 cm. Echogenicity within normal limits. No mass or hydronephrosis visualized. Bladder: Appears normal for degree of bladder distention. IMPRESSION: Mild right hydronephrosis. Electronically Signed   By: Lupita RaiderJames  Green Jr, M.D.   On: 02/03/2017 13:12   Ct Renal Stone Study  Result Date: 02/03/2017 CLINICAL DATA:  Right flank pain for 3 days. EXAM: CT ABDOMEN AND PELVIS WITHOUT CONTRAST TECHNIQUE: Multidetector CT imaging of the abdomen and pelvis was performed following the standard protocol without IV contrast. COMPARISON:  None. FINDINGS: Lower chest: Or The lung bases are clear of acute process. No pleural effusion or pulmonary lesions. The heart is normal in size. No pericardial effusion. The distal esophagus and aorta are unremarkable. Hepatobiliary: No focal hepatic lesions or intrahepatic biliary dilatation. The gallbladder is normal. No common bile duct dilatation. Pancreas: No mass, inflammation or ductal dilatation. Spleen: Normal size.  No focal lesions. Adrenals/Urinary Tract: The adrenal glands and left kidney are normal. Small midpole left renal calculus. The right kidney demonstrates moderate hydronephrosis and mild perinephric interstitial changes. The right upper ureter is also dilated down to an obstructing right upper ureteral calculus measuring 6.0 x 4.0 mm. This is located at the L3 level. No distal ureteral calculi. No bladder calculi. No worrisome renal or bladder lesions. Stomach/Bowel: The stomach, duodenum, small bowel and colon are grossly normal  without oral contrast. No inflammatory changes, mass lesions or obstructive findings. The terminal ileum and appendix are normal. Vascular/Lymphatic: The aorta is normal in caliber. No atheroscerlotic calcifications. No mesenteric of retroperitoneal mass or adenopathy. Small scattered lymph nodes are noted. Reproductive: The prostate gland and seminal vesicles are unremarkable. Other: No pelvic mass or adenopathy. No free pelvic fluid collections. No inguinal mass or adenopathy. No abdominal wall hernia or subcutaneous lesions. Musculoskeletal: Are no significant bony findings. IMPRESSION: 6 x 4 mm right upper ureteral calculus causing moderate to high-grade obstruction. Small midpole left renal calculus. Electronically Signed   By: Rudie MeyerP.  Gallerani M.D.   On: 02/03/2017 12:05    Procedures Procedures (including critical care time)  Medications Ordered in ED Medications  oxyCODONE-acetaminophen (PERCOCET/ROXICET) 5-325 MG per tablet 1 tablet (1 tablet Oral Given 02/03/17 1001)  oxyCODONE-acetaminophen (PERCOCET/ROXICET) 5-325 MG per tablet 1 tablet (not administered)  ketorolac (TORADOL) injection 60 mg (60 mg Intramuscular Given 02/03/17 1329)  ondansetron (ZOFRAN-ODT) disintegrating tablet 4 mg (4 mg Oral Given 02/03/17 1329)     Initial Impression / Assessment and Plan / ED Course  I have reviewed the triage vital signs and the nursing notes.  Pertinent labs & imaging results that were available during my care of the patient were reviewed by me and considered in my medical decision making (see chart for details).    Pt has been diagnosed with a 6mm x 4mm Kidney Stone via CT. There is no evidence of significant hydronephrosis, serum creatine WNL, vitals sign stable and the pt does not have irratractable vomiting. Pt will be dc home with pain medications & has been advised to follow up with Urologist.  His blood pressure was elevated in the emergency department, counseled him to have this rechecked.   Counseled patient on return precautions. Patient agrees and voices understanding to plan. Has no complaints prior to discharge.   Final Clinical Impressions(s) / ED Diagnoses   Final diagnoses:  Nephrolithiasis    ED Discharge Orders        Ordered    oxyCODONE-acetaminophen (PERCOCET/ROXICET) 5-325 MG tablet  Every 4 hours PRN     02/03/17 1409    ibuprofen (ADVIL,MOTRIN) 800 MG tablet  3 times daily     02/03/17 1409    tamsulosin (FLOMAX) 0.4 MG CAPS capsule  Daily     02/03/17 1409       Kellie Shropshire, PA-C 02/03/17 1412    Alvira Monday, MD 02/03/17 Paulo Fruit

## 2017-02-03 NOTE — ED Notes (Signed)
PA at bedside.

## 2019-01-21 IMAGING — CT CT RENAL STONE PROTOCOL
2 of 4 series · 16 of 46 positions shown, 18 images · non-contrast
Comparison: None.

CLINICAL DATA: Right flank pain for 3 days.

EXAM:
CT ABDOMEN AND PELVIS WITHOUT CONTRAST
TECHNIQUE: Multidetector CT imaging of the abdomen and pelvis was performed
following the standard protocol without IV contrast.

[Series 3: ap without · axial · non-contrast · 0.78mm/px · z∈[+735,+1150]mm · 13 of 95 slices shown, 15 images]
[im 6/95  soft-tissue]
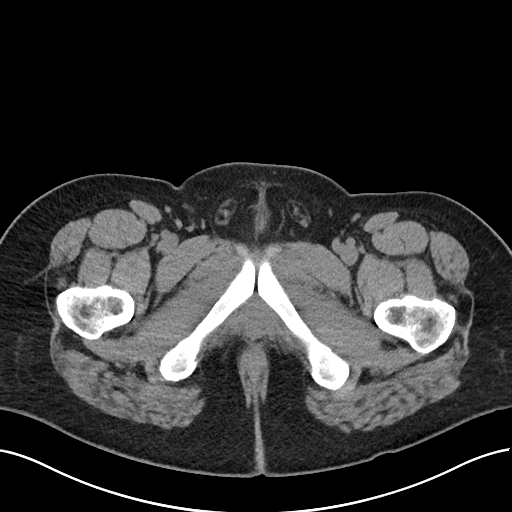
[im 6/95  bone]
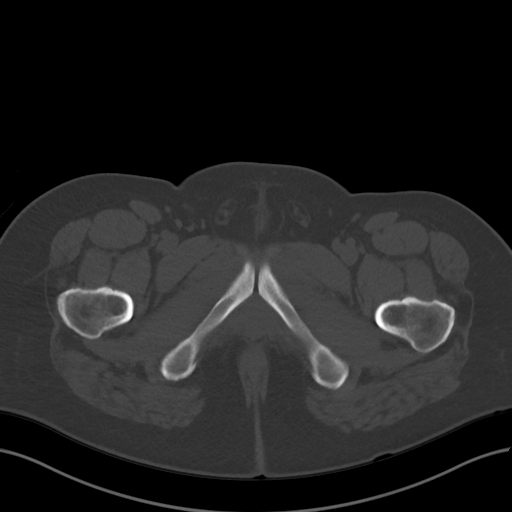
[im 12/95  soft-tissue]
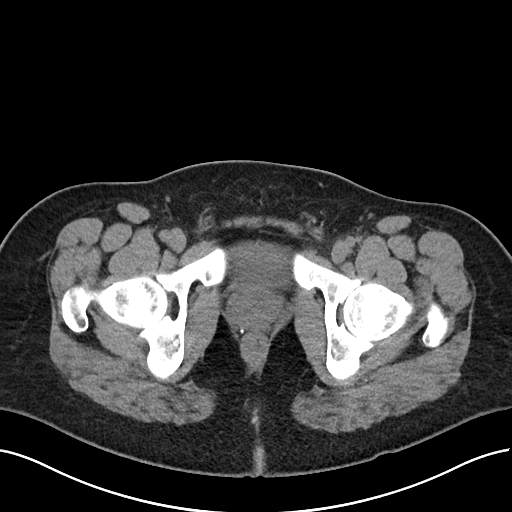
[im 23/95  soft-tissue]
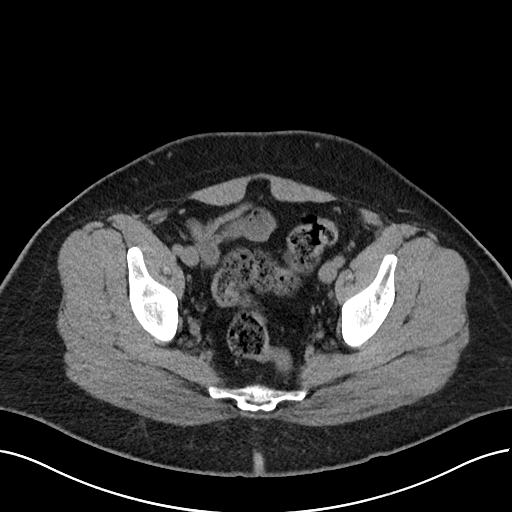
[im 28/95  soft-tissue]
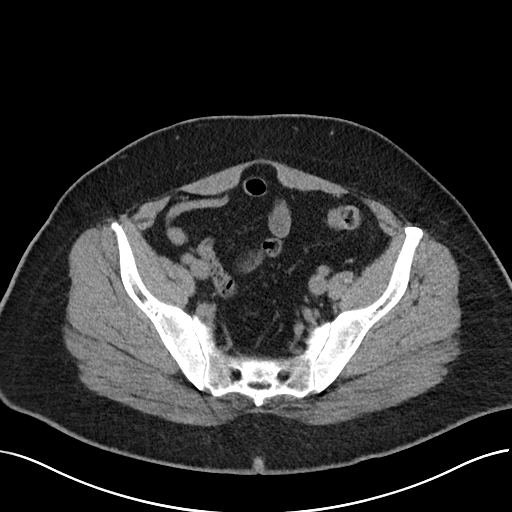
[im 34/95  soft-tissue]
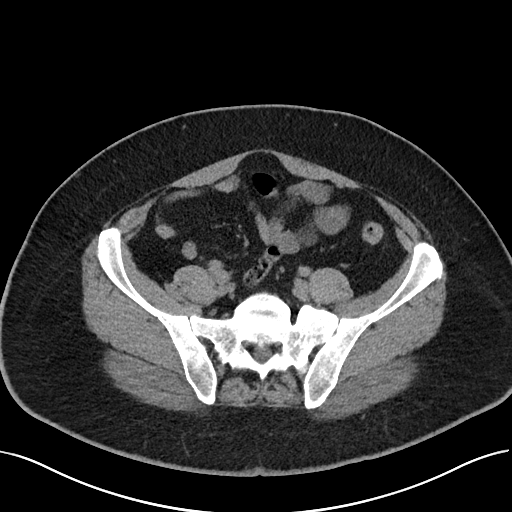
[im 39/95  soft-tissue]
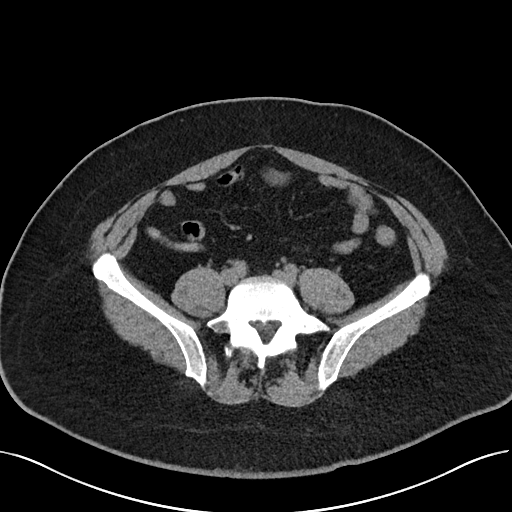
[im 50/95  soft-tissue]
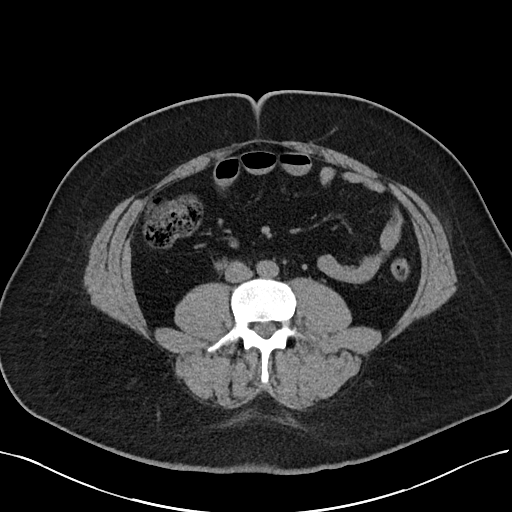
[im 56/95  soft-tissue]
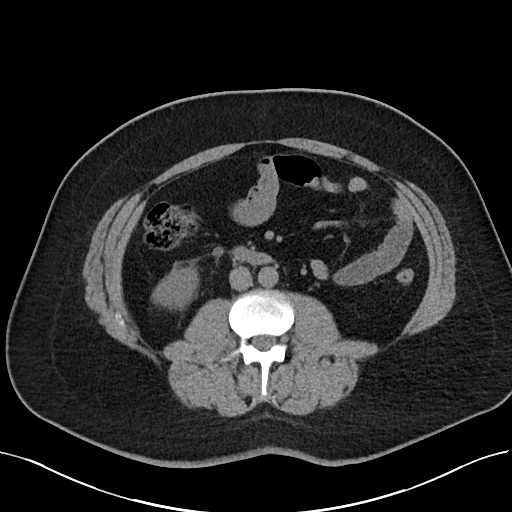
[im 61/95  soft-tissue]
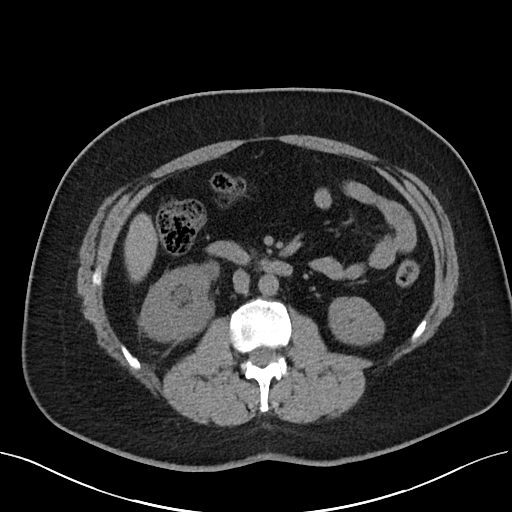
[im 61/95  bone]
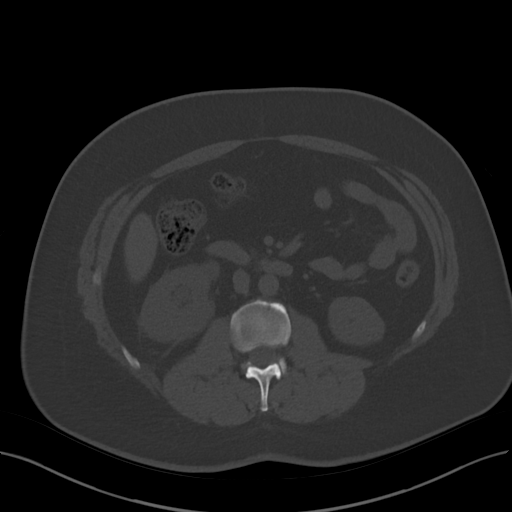
[im 67/95  soft-tissue]
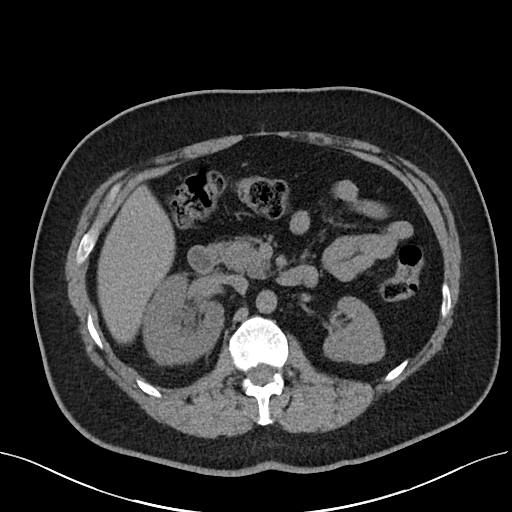
[im 72/95  soft-tissue]
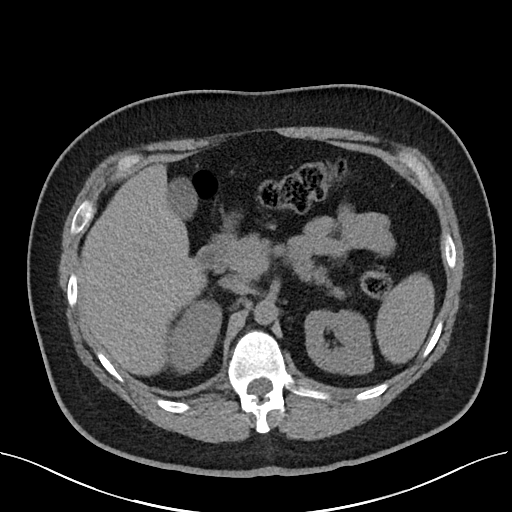
[im 83/95  soft-tissue]
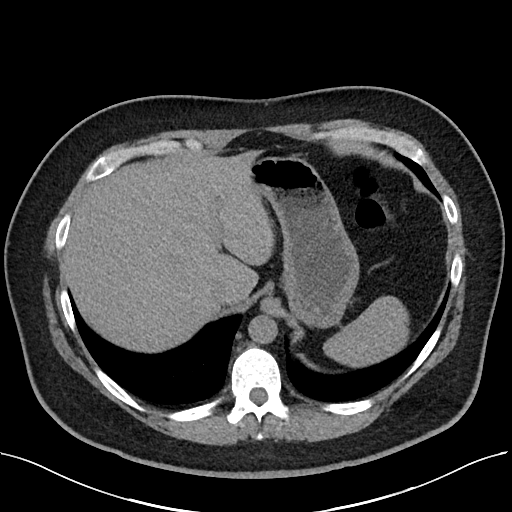
[im 89/95  soft-tissue]
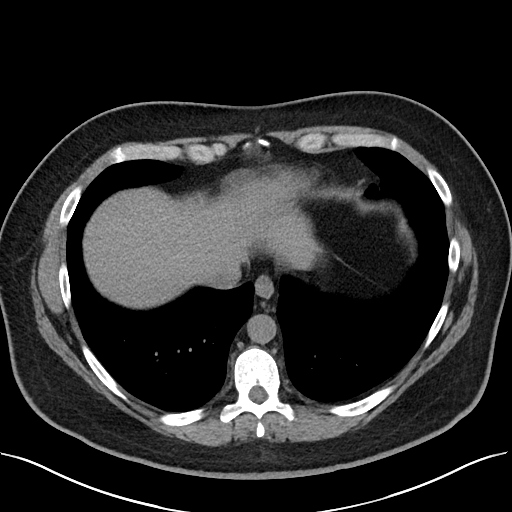

[Series 6: cor · coronal · 0.84mm/px · 3 of 111 slices shown]
[im 37/111  soft-tissue]
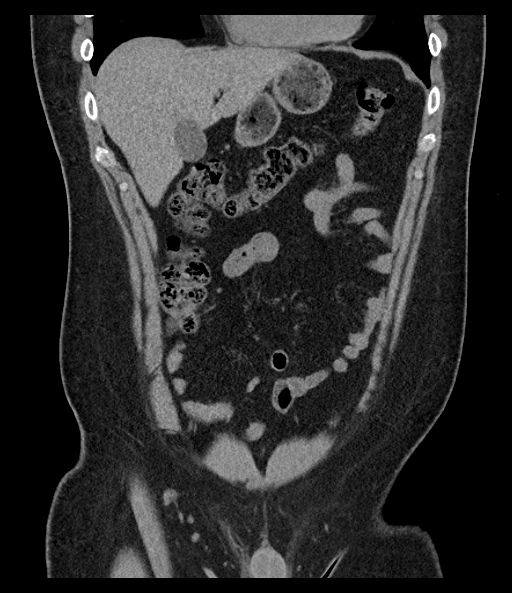
[im 49/111  soft-tissue]
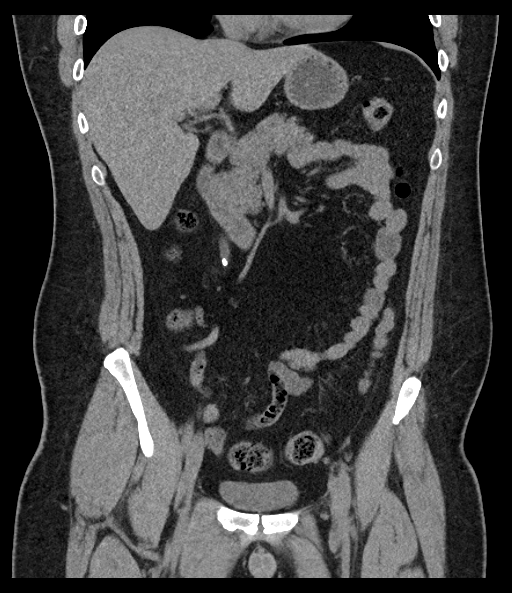
[im 62/111  soft-tissue]
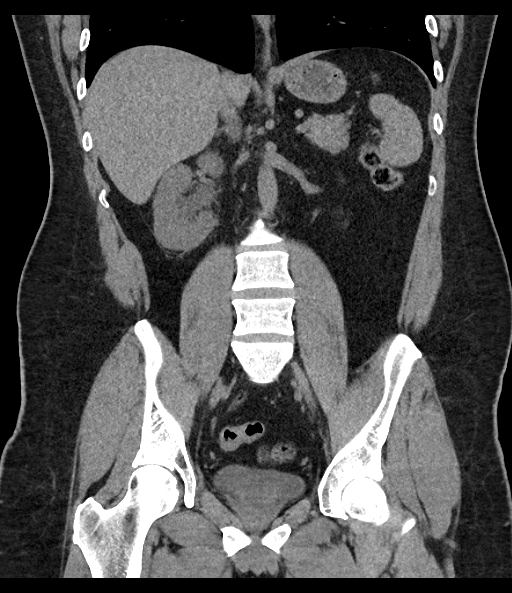

[16 of 46 positions shown; findings below may reference images not displayed]

FINDINGS: Lower chest: Or The lung bases are clear of acute process. No
pleural effusion or pulmonary lesions. The heart is normal in size.
No pericardial effusion. The distal esophagus and aorta are
unremarkable.

Hepatobiliary: No focal hepatic lesions or intrahepatic biliary
dilatation. The gallbladder is normal. No common bile duct
dilatation.

Pancreas: No mass, inflammation or ductal dilatation.

Spleen: Normal size.  No focal lesions.

Adrenals/Urinary Tract: The adrenal glands and left kidney are
normal. Small midpole left renal calculus. The right kidney
demonstrates moderate hydronephrosis and mild perinephric
interstitial changes. The right upper ureter is also dilated down to
an obstructing right upper ureteral calculus measuring 6.0 x 4.0 mm.
This is located at the L3 level. No distal ureteral calculi. No
bladder calculi. No worrisome renal or bladder lesions.

Stomach/Bowel: The stomach, duodenum, small bowel and colon are
grossly normal without oral contrast. No inflammatory changes, mass
lesions or obstructive findings. The terminal ileum and appendix are
normal.

Vascular/Lymphatic: The aorta is normal in caliber. No
atheroscerlotic calcifications. No mesenteric of retroperitoneal
mass or adenopathy. Small scattered lymph nodes are noted.

Reproductive: The prostate gland and seminal vesicles are
unremarkable.

Other: No pelvic mass or adenopathy. No free pelvic fluid
collections. No inguinal mass or adenopathy. No abdominal wall
hernia or subcutaneous lesions.

Musculoskeletal: Are no significant bony findings.
IMPRESSION: 6 x 4 mm right upper ureteral calculus causing moderate to
high-grade obstruction.

Small midpole left renal calculus.

## 2019-04-25 IMAGING — US US RENAL
1 series · 14 of 25 positions shown · non-contrast
Comparison: CT scan of same day.

CLINICAL DATA: Acute right flank pain.

EXAM:
RENAL / URINARY TRACT ULTRASOUND COMPLETE

[Series 1: us renal · 0.23mm/px · 30 acquisitions, 14 frames shown]
[im 1/30]
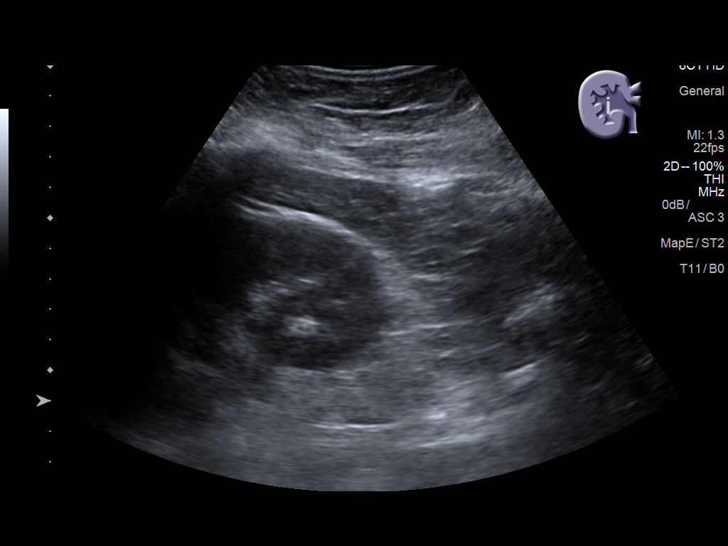
[im 3/30]
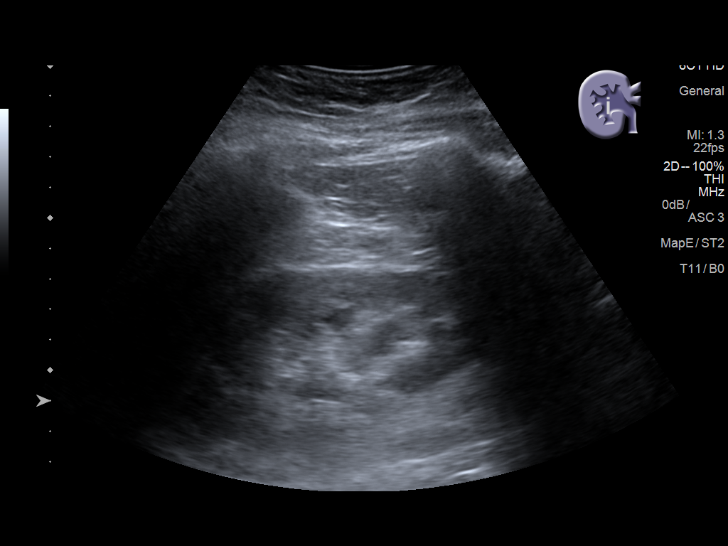
[im 5/30]
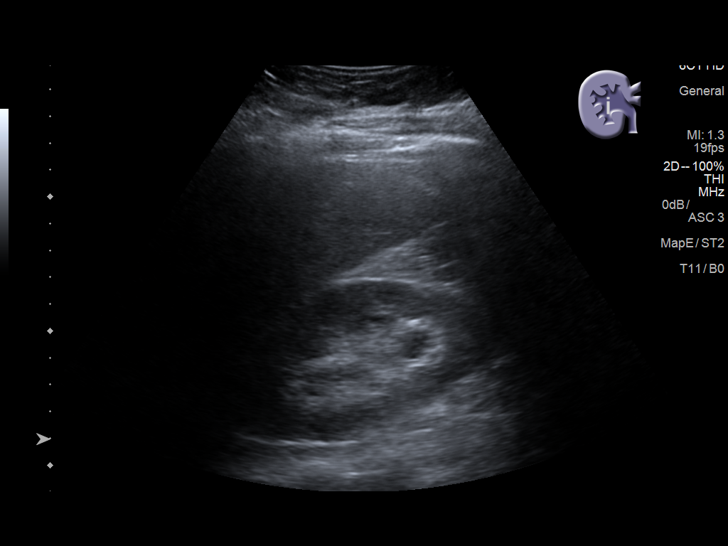
[im 8/30]
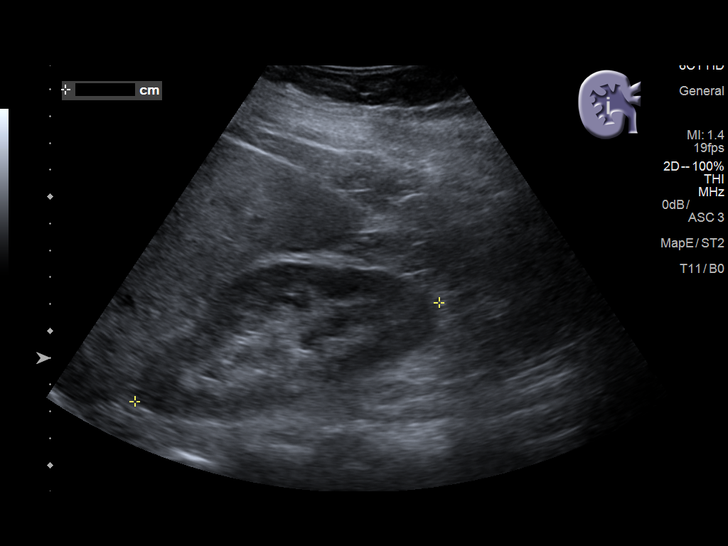
[im 10/30]
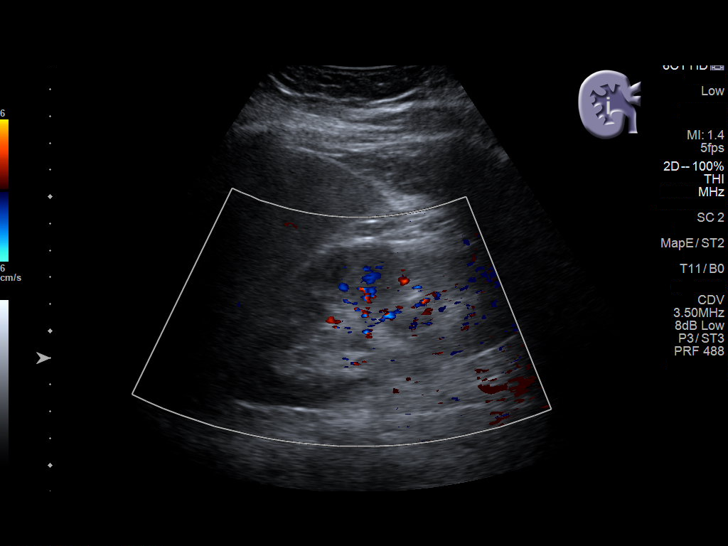
[im 11/30]
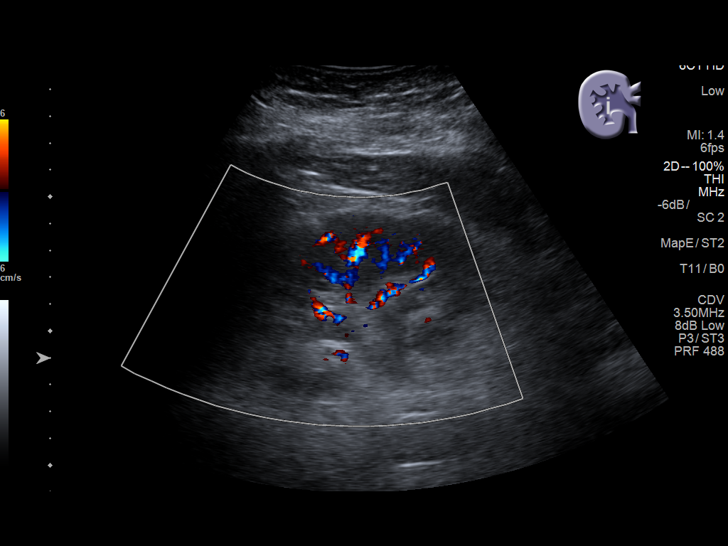
[im 14/30]
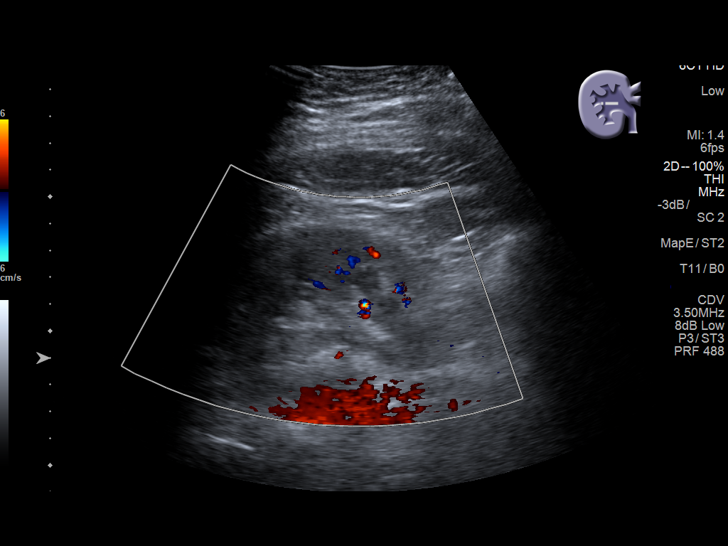
[im 16/30]
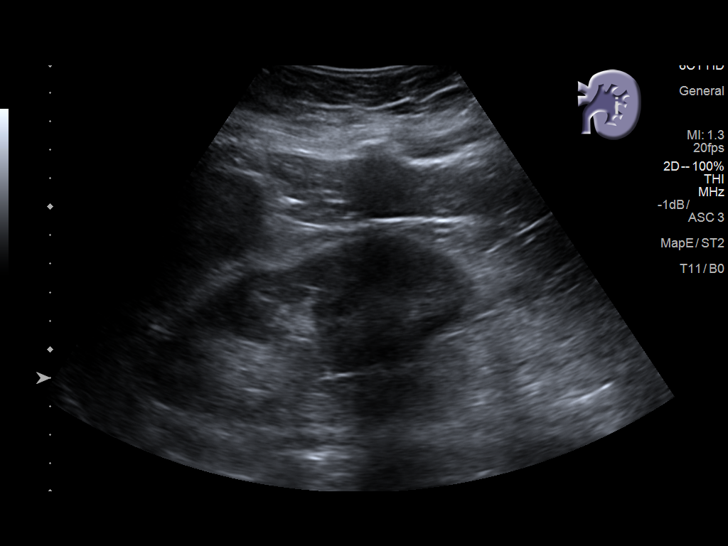
[im 19/30]
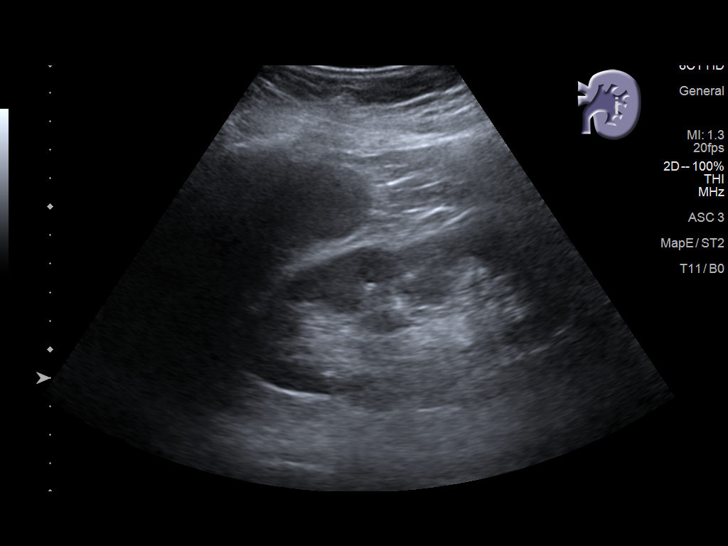
[im 20/30]
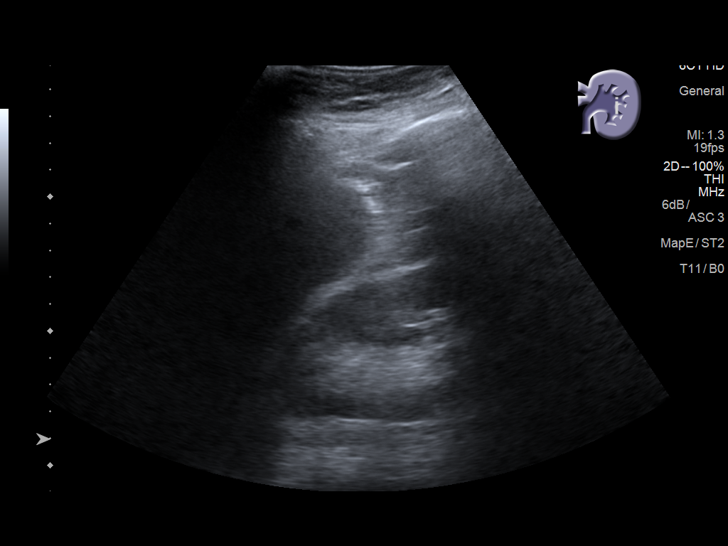
[im 22/30]
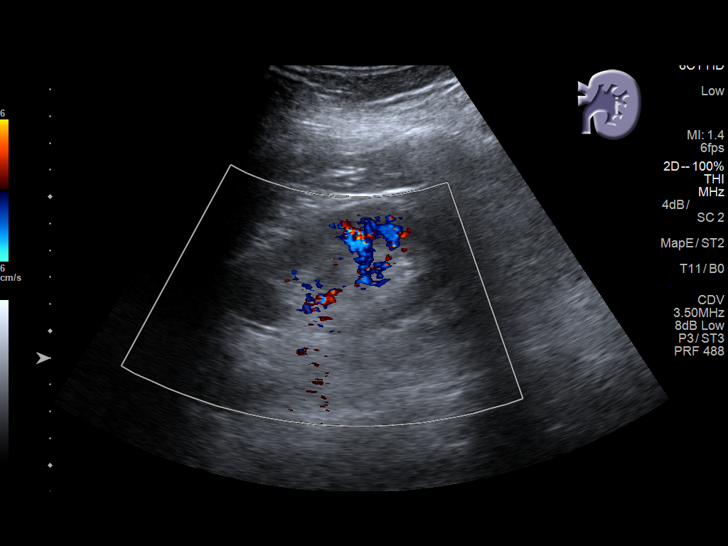
[im 25/30]
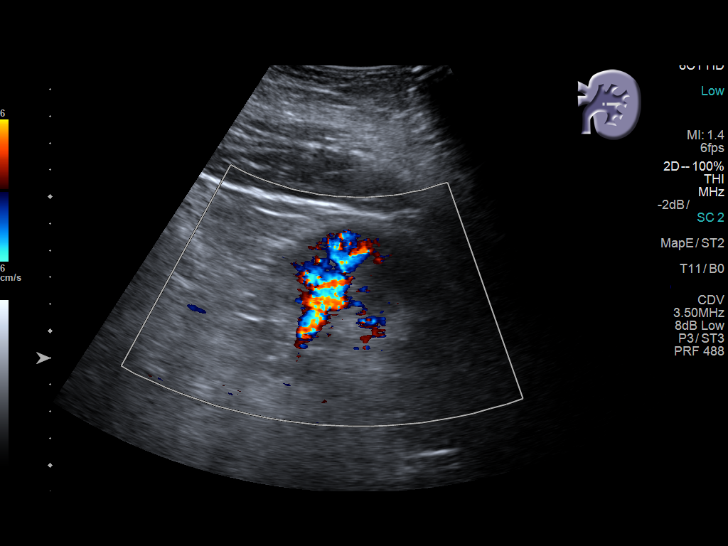
[im 27/30]
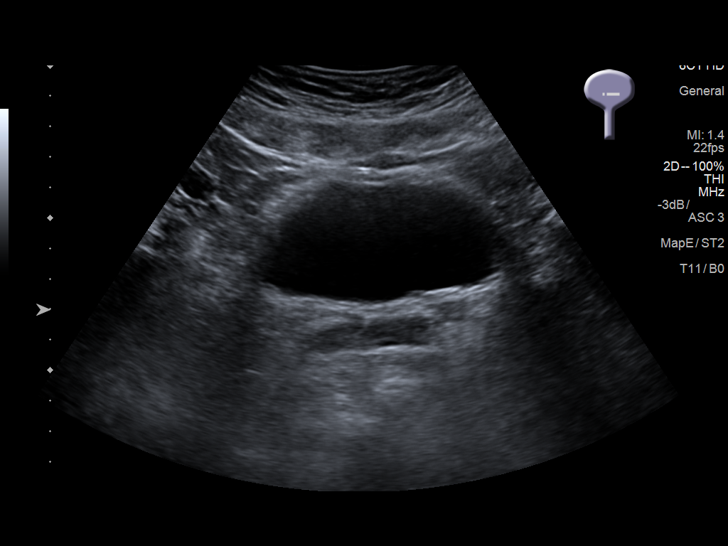
[im 30/30]
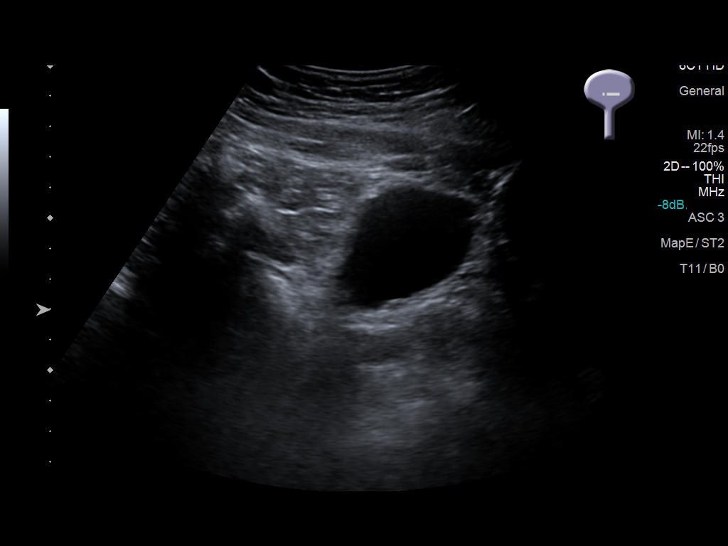

[14 of 25 positions shown; findings below may reference images not displayed]

FINDINGS: Right Kidney:

Length: 11.9 cm.. Echogenicity within normal limits. No mass
visualized. Mild right hydronephrosis is noted.

Left Kidney:

Length: 12.5 cm. Echogenicity within normal limits. No mass or
hydronephrosis visualized.

Bladder:

Appears normal for degree of bladder distention.
IMPRESSION: Mild right hydronephrosis.

## 2022-07-09 ENCOUNTER — Other Ambulatory Visit: Payer: Self-pay | Admitting: Family Medicine
